# Patient Record
Sex: Female | Born: 1985 | Race: Black or African American | Hispanic: No | Marital: Single | Smoking: Former smoker
Health system: Southern US, Community
[De-identification: ages and names within clinical notes are randomized; demographics above are authoritative.]

## PROBLEM LIST (undated history)

## (undated) DIAGNOSIS — N809 Endometriosis, unspecified: Secondary | ICD-10-CM

---
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
## 2007-02-16 ENCOUNTER — Emergency Department (HOSPITAL_COMMUNITY): Admission: EM | Admit: 2007-02-16 | Discharge: 2007-02-16 | Payer: Self-pay | Admitting: Emergency Medicine

## 2008-02-06 ENCOUNTER — Emergency Department (HOSPITAL_COMMUNITY): Admission: EM | Admit: 2008-02-06 | Discharge: 2008-02-06 | Payer: Self-pay | Admitting: Emergency Medicine

## 2009-01-26 ENCOUNTER — Other Ambulatory Visit: Admission: RE | Admit: 2009-01-26 | Discharge: 2009-01-26 | Payer: Self-pay | Admitting: Family Medicine

## 2009-02-18 ENCOUNTER — Emergency Department (HOSPITAL_COMMUNITY): Admission: EM | Admit: 2009-02-18 | Discharge: 2009-02-19 | Payer: Self-pay | Admitting: Emergency Medicine

## 2012-08-02 ENCOUNTER — Emergency Department (HOSPITAL_COMMUNITY): Payer: BC Managed Care – PPO

## 2012-08-02 ENCOUNTER — Encounter (HOSPITAL_COMMUNITY): Payer: Self-pay | Admitting: Emergency Medicine

## 2012-08-02 ENCOUNTER — Emergency Department (HOSPITAL_COMMUNITY)
Admission: EM | Admit: 2012-08-02 | Discharge: 2012-08-02 | Disposition: A | Discharge status: Home / Self Care | Payer: BC Managed Care – PPO | Attending: Emergency Medicine | Admitting: Emergency Medicine

## 2012-08-02 DIAGNOSIS — N8000 Endometriosis of the uterus, unspecified: Secondary | ICD-10-CM | POA: Insufficient documentation

## 2012-08-02 DIAGNOSIS — N898 Other specified noninflammatory disorders of vagina: Secondary | ICD-10-CM | POA: Insufficient documentation

## 2012-08-02 DIAGNOSIS — R109 Unspecified abdominal pain: Secondary | ICD-10-CM

## 2012-08-02 DIAGNOSIS — N939 Abnormal uterine and vaginal bleeding, unspecified: Secondary | ICD-10-CM

## 2012-08-02 DIAGNOSIS — F17200 Nicotine dependence, unspecified, uncomplicated: Secondary | ICD-10-CM | POA: Insufficient documentation

## 2012-08-02 LAB — URINALYSIS, ROUTINE W REFLEX MICROSCOPIC
Glucose, UA: NEGATIVE mg/dL
Nitrite: NEGATIVE
Protein, ur: NEGATIVE mg/dL
Urobilinogen, UA: 0.2 mg/dL (ref 0.0–1.0)
pH: 6.5 (ref 5.0–8.0)

## 2012-08-02 LAB — WET PREP, GENITAL
Trich, Wet Prep: NONE SEEN
Yeast Wet Prep HPF POC: NONE SEEN

## 2012-08-02 LAB — POCT PREGNANCY, URINE: Preg Test, Ur: NEGATIVE

## 2012-08-02 LAB — URINE MICROSCOPIC-ADD ON

## 2012-08-02 NOTE — ED Provider Notes (Signed)
History     CSN: [id number]  Arrival date & time 08/02/12  1022   First MD Initiated Contact with Patient 08/02/12 1118      Chief Complaint  Patient presents with  . Abdominal Cramping    (Consider location/radiation/quality/duration/timing/severity/associated sxs/prior treatment) Patient is a 26 y.o. female presenting with cramps. The history is provided by the patient and medical records. History Limited By: none.  Abdominal Cramping The primary symptoms of the illness include abdominal pain and vaginal bleeding. The primary symptoms of the illness do not include fever, fatigue, shortness of breath, nausea, vomiting, diarrhea, dysuria or vaginal discharge.  Additional symptoms associated with the illness include frequency. Symptoms associated with the illness do not include diaphoresis, constipation, urgency, hematuria or back pain.   SUHAYLA CHISOM is a 26 y.o. female G1P2 who presents to the emergency department complaining of vaginal bleeding since 10:30 PM last night. Last menstrual cycle is approximately 2 weeks ago and normal per the patient.  After the vaginal bleeding began patient began to experience severe lower abdominal cramps and pelvic pressure. About 11 PM the patient began to notice large blood clots and increased bleeding.  Bleeding was heavy throughout most of the night but has decreased this morning.  Patient denies sexual activity and the last 5-6 months.  She admits to an elective pregnancy termination last year and states irregular cycles and bleeding since then. She denies vaginal discharge, odor, pelvic pain.  Her last GYN exam and Pap smear was approximately 5 years ago.  She denies fatigue, fever, chills, nausea, vomiting, diarrhea, chest pain, shortness of breath, back pain.  She has a history of Chlamydia infection 6 years ago.  Nothing makes symptoms better or worse. She hasn't tried anything for the symptoms.  History reviewed. No pertinent past medical  history.  History reviewed. No pertinent past surgical history.  No family history on file.  History  Substance Use Topics  . Smoking status: Current Everyday Smoker  . Smokeless tobacco: Not on file  . Alcohol Use: Yes    OB History    Grav Para Term Preterm Abortions TAB SAB Ect Mult Living                  Review of Systems  Constitutional: Negative for fever, diaphoresis, appetite change, fatigue and unexpected weight change.  HENT: Negative for mouth sores, trouble swallowing, neck pain and neck stiffness.   Respiratory: Negative for cough, chest tightness, shortness of breath, wheezing and stridor.   Cardiovascular: Negative for chest pain and palpitations.  Gastrointestinal: Positive for abdominal pain. Negative for nausea, vomiting, diarrhea, constipation, blood in stool, abdominal distention and rectal pain.  Genitourinary: Positive for frequency, vaginal bleeding, menstrual problem and pelvic pain (pelvic pressure). Negative for dysuria, urgency, hematuria, flank pain, vaginal discharge, difficulty urinating and vaginal pain.  Musculoskeletal: Negative for back pain.  Skin: Negative for rash.  Neurological: Negative for weakness.  Hematological: Negative for adenopathy.  Psychiatric/Behavioral: Negative for confusion.  All other systems reviewed and are negative.    Allergies  Latex  Home Medications   Current Outpatient Rx  Name Route Sig Dispense Refill  . LINACLOTIDE 145 MCG PO CAPS Oral Take 145 mcg by mouth daily.    . ADULT MULTIVITAMIN W/MINERALS CH Oral Take 1 tablet by mouth daily.      BP 125/78  Pulse 72  Temp 98.2 F (36.8 C) (Oral)  Resp 16  SpO2 99%  LMP 08/01/2012  Physical Exam  Nursing  note and vitals reviewed. Constitutional: She appears well-developed and well-nourished. No distress.  HENT:  Head: Normocephalic and atraumatic.  Mouth/Throat: Oropharynx is clear and moist. No oropharyngeal exudate.  Eyes: Conjunctivae are normal.  No scleral icterus.  Neck: Normal range of motion. Neck supple.  Cardiovascular: Normal rate, regular rhythm, normal heart sounds and intact distal pulses.  Exam reveals no gallop and no friction rub.   No murmur heard. Pulmonary/Chest: Effort normal and breath sounds normal. No respiratory distress. She has no wheezes.  Abdominal: Soft. Bowel sounds are normal. She exhibits no mass. There is no tenderness. There is no rebound and no guarding.  Genitourinary: There is no rash, tenderness or lesion on the right labia. There is no rash or lesion on the left labia. Cervix exhibits motion tenderness and friability. Cervix exhibits no discharge. Right adnexum displays tenderness. Left adnexum displays tenderness. There is bleeding around the vagina. No vaginal discharge found.  Musculoskeletal: Normal range of motion. She exhibits no edema.  Neurological: She is alert.       Speech is clear and goal oriented Moves extremities without ataxia  Skin: Skin is warm and dry. She is not diaphoretic.  Psychiatric: She has a normal mood and affect.    ED Course  Procedures (including critical care time)   Labs Reviewed  URINALYSIS, ROUTINE W REFLEX MICROSCOPIC   No results found. Results for orders placed during the hospital encounter of 08/02/12  WET PREP, GENITAL      Component Value Range   Yeast Wet Prep HPF POC NONE SEEN  NONE SEEN   Trich, Wet Prep NONE SEEN  NONE SEEN   Clue Cells Wet Prep HPF POC FEW (*) NONE SEEN   WBC, Wet Prep HPF POC FEW (*) NONE SEEN   Results for orders placed during the hospital encounter of 08/02/12  WET PREP, GENITAL      Component Value Range   Yeast Wet Prep HPF POC NONE SEEN  NONE SEEN   Trich, Wet Prep NONE SEEN  NONE SEEN   Clue Cells Wet Prep HPF POC FEW (*) NONE SEEN   WBC, Wet Prep HPF POC FEW (*) NONE SEEN  POCT PREGNANCY, URINE      Component Value Range   Preg Test, Ur NEGATIVE  NEGATIVE   US Transvaginal Non-ob  08/02/2012  *RADIOLOGY REPORT*   Clinical Data:  Abdominal cramping.  Ovarian torsion.  Heavy bleeding with clots.  Pelvic pain.  TRANSABDOMINAL AND TRANSVAGINAL ULTRASOUND OF PELVIS DOPPLER ULTRASOUND OF OVARIES  Technique:  Both transabdominal and transvaginal ultrasound examinations of the pelvis were performed. Transabdominal technique was performed for global imaging of the pelvis including uterus, ovaries, adnexal regions, and pelvic cul-de-sac.  It was necessary to proceed with endovaginal exam following the transabdominal exam to visualize the uterus, adnexa and endometrium.  Color and duplex Doppler ultrasound was utilized to evaluate blood flow to the ovaries.  Comparison:  None.  Findings:  Uterus:  7.4 cm x 5.0 cm x 5.4 cm.  Heterogeneous echotexture compatible with adenomyosis.  Endometrium:  9 mm, within normal limits.  Right ovary: Normal appearance/no adnexal mass  Left ovary:   Normal appearance/no adnexal mass  Pulsed Doppler evaluation demonstrates normal low-resistance arterial and venous waveforms in both ovaries.  Small amount of free fluid is present in the anatomic pelvis.  IMPRESSION:  1.  Heterogeneous uterine echotexture suggesting adenomyosis. 2.  Physiologic appearance of the uterus and adnexa.  Negative for torsion.  Original Report Authenticated By:  Andreas Newport, M.D.   US Pelvis Complete  08/02/2012  *RADIOLOGY REPORT*  Clinical Data:  Abdominal cramping.  Ovarian torsion.  Heavy bleeding with clots.  Pelvic pain.  TRANSABDOMINAL AND TRANSVAGINAL ULTRASOUND OF PELVIS DOPPLER ULTRASOUND OF OVARIES  Technique:  Both transabdominal and transvaginal ultrasound examinations of the pelvis were performed. Transabdominal technique was performed for global imaging of the pelvis including uterus, ovaries, adnexal regions, and pelvic cul-de-sac.  It was necessary to proceed with endovaginal exam following the transabdominal exam to visualize the uterus, adnexa and endometrium.  Color and duplex Doppler ultrasound was  utilized to evaluate blood flow to the ovaries.  Comparison:  None.  Findings:  Uterus:  7.4 cm x 5.0 cm x 5.4 cm.  Heterogeneous echotexture compatible with adenomyosis.  Endometrium:  9 mm, within normal limits.  Right ovary: Normal appearance/no adnexal mass  Left ovary:   Normal appearance/no adnexal mass  Pulsed Doppler evaluation demonstrates normal low-resistance arterial and venous waveforms in both ovaries.  Small amount of free fluid is present in the anatomic pelvis.  IMPRESSION:  1.  Heterogeneous uterine echotexture suggesting adenomyosis. 2.  Physiologic appearance of the uterus and adnexa.  Negative for torsion.  Original Report Authenticated By: Andreas Newport, M.D.   [place] Art/ven Flow Abd Pelv Doppler  08/02/2012  *RADIOLOGY REPORT*  Clinical Data:  Abdominal cramping.  Ovarian torsion.  Heavy bleeding with clots.  Pelvic pain.  TRANSABDOMINAL AND TRANSVAGINAL ULTRASOUND OF PELVIS DOPPLER ULTRASOUND OF OVARIES  Technique:  Both transabdominal and transvaginal ultrasound examinations of the pelvis were performed. Transabdominal technique was performed for global imaging of the pelvis including uterus, ovaries, adnexal regions, and pelvic cul-de-sac.  It was necessary to proceed with endovaginal exam following the transabdominal exam to visualize the uterus, adnexa and endometrium.  Color and duplex Doppler ultrasound was utilized to evaluate blood flow to the ovaries.  Comparison:  None.  Findings:  Uterus:  7.4 cm x 5.0 cm x 5.4 cm.  Heterogeneous echotexture compatible with adenomyosis.  Endometrium:  9 mm, within normal limits.  Right ovary: Normal appearance/no adnexal mass  Left ovary:   Normal appearance/no adnexal mass  Pulsed Doppler evaluation demonstrates normal low-resistance arterial and venous waveforms in both ovaries.  Small amount of free fluid is present in the anatomic pelvis.  IMPRESSION:  1.  Heterogeneous uterine echotexture suggesting adenomyosis. 2.  Physiologic appearance of  the uterus and adnexa.  Negative for torsion.  Original Report Authenticated By: Andreas Newport, M.D.    1. Abdominal pain   2. Vaginal bleeding       MDM  KHADEEJAH CASTNER presents with vaginal bleeding and abdominal pain.  Will rule out pregnancy, UTI and perform an STI screen.  Pelvic exam with cervical motion tenderness, adnexal tenderness and cervical friability gives way to concern for PID, however pt has been celibate for >88mos and without symptoms prior to last night.  Pelvic ultrasound to look for masses or cysts.  UA without evidence of a UTI.  [place] with adenomyosis; no evidence of torsion, masses or fullness making PID unlikely.  This would explain her irregular menses and heavy and painful menstruation.  I will have her follow up with a GYN.    1. Medications: usual home meds 2. Treatment: rest, NSAIDs for pain control 3. Follow Up: with GYN '        Zyrell Carmean, PA-C 08/02/12 1639

## 2012-08-02 NOTE — ED Notes (Signed)
Pt. Stated, i started having abd. Cramping with bleeding, I just had a period 2 weeks ago.

## 2012-08-02 NOTE — ED Provider Notes (Signed)
Medical screening examination/treatment/procedure(s) were performed by non-physician practitioner and as supervising physician I was immediately available for consultation/collaboration.   Milcah Dulany B. Bernette Mayers, MD 08/02/12 2109

## 2013-07-25 ENCOUNTER — Encounter (HOSPITAL_COMMUNITY): Payer: Self-pay | Admitting: Emergency Medicine

## 2013-07-25 DIAGNOSIS — Z3202 Encounter for pregnancy test, result negative: Secondary | ICD-10-CM | POA: Insufficient documentation

## 2013-07-25 DIAGNOSIS — F17200 Nicotine dependence, unspecified, uncomplicated: Secondary | ICD-10-CM | POA: Insufficient documentation

## 2013-07-25 DIAGNOSIS — Z5320 Procedure and treatment not carried out because of patient's decision for unspecified reasons: Secondary | ICD-10-CM | POA: Insufficient documentation

## 2013-07-25 DIAGNOSIS — R509 Fever, unspecified: Secondary | ICD-10-CM | POA: Insufficient documentation

## 2013-07-25 LAB — URINALYSIS, ROUTINE W REFLEX MICROSCOPIC
Hgb urine dipstick: NEGATIVE
Ketones, ur: NEGATIVE mg/dL
Protein, ur: NEGATIVE mg/dL
Urobilinogen, UA: 0.2 mg/dL (ref 0.0–1.0)

## 2013-07-25 LAB — CBC WITH DIFFERENTIAL/PLATELET
Basophils Absolute: 0 10*3/uL (ref 0.0–0.1)
HCT: 38 % (ref 36.0–46.0)
Hemoglobin: 12.9 g/dL (ref 12.0–15.0)
Lymphocytes Relative: 9 % — ABNORMAL LOW (ref 12–46)
Monocytes Absolute: 0.6 10*3/uL (ref 0.1–1.0)
Monocytes Relative: 5 % (ref 3–12)
Neutro Abs: 8.9 10*3/uL — ABNORMAL HIGH (ref 1.7–7.7)
Neutrophils Relative %: 85 % — ABNORMAL HIGH (ref 43–77)
WBC: 10.5 10*3/uL (ref 4.0–10.5)

## 2013-07-25 LAB — COMPREHENSIVE METABOLIC PANEL
AST: 15 U/L (ref 0–37)
Alkaline Phosphatase: 45 U/L (ref 39–117)
BUN: 8 mg/dL (ref 6–23)
CO2: 20 mEq/L (ref 19–32)
Chloride: 103 mEq/L (ref 96–112)
Creatinine, Ser: 0.67 mg/dL (ref 0.50–1.10)
GFR calc non Af Amer: >90 mL/min (ref >90)
Potassium: 3.5 mEq/L (ref 3.5–5.1)
Total Bilirubin: 0.4 mg/dL (ref 0.3–1.2)

## 2013-07-25 LAB — CG4 I-STAT (LACTIC ACID): Lactic Acid, Venous: 2.49 mmol/L — ABNORMAL HIGH (ref 0.5–2.2)

## 2013-07-25 LAB — URINE MICROSCOPIC-ADD ON

## 2013-07-25 MED ORDER — ACETAMINOPHEN 325 MG PO TABS
650.0000 mg | ORAL_TABLET | Freq: Four times a day (QID) | ORAL | Status: DC | PRN
  Administered 2013-07-25: 650 mg via ORAL
  Filled 2013-07-25: qty 2

## 2013-07-25 NOTE — ED Notes (Signed)
PT. REPORTS FEVER , CHILLS , HEADACHE AND SLIGHT DYSURIA ONSET TODAY .

## 2013-07-26 ENCOUNTER — Emergency Department (HOSPITAL_COMMUNITY)
Admission: EM | Admit: 2013-07-26 | Discharge: 2013-07-26 | Discharge status: Against Medical Advice | Payer: Self-pay | Attending: Emergency Medicine | Admitting: Emergency Medicine

## 2013-07-27 LAB — URINE CULTURE

## 2013-12-31 ENCOUNTER — Encounter (HOSPITAL_BASED_OUTPATIENT_CLINIC_OR_DEPARTMENT_OTHER): Payer: Self-pay | Admitting: Emergency Medicine

## 2013-12-31 ENCOUNTER — Emergency Department (HOSPITAL_BASED_OUTPATIENT_CLINIC_OR_DEPARTMENT_OTHER)
Admission: EM | Admit: 2013-12-31 | Discharge: 2013-12-31 | Disposition: A | Discharge status: Home / Self Care | Payer: BC Managed Care – PPO | Attending: Emergency Medicine | Admitting: Emergency Medicine

## 2013-12-31 DIAGNOSIS — M79602 Pain in left arm: Secondary | ICD-10-CM

## 2013-12-31 DIAGNOSIS — Z91040 Latex allergy status: Secondary | ICD-10-CM | POA: Insufficient documentation

## 2013-12-31 DIAGNOSIS — M79609 Pain in unspecified limb: Secondary | ICD-10-CM | POA: Insufficient documentation

## 2013-12-31 DIAGNOSIS — F17200 Nicotine dependence, unspecified, uncomplicated: Secondary | ICD-10-CM | POA: Insufficient documentation

## 2013-12-31 NOTE — ED Notes (Addendum)
Pt having left lower arm pain since 2 am.  Pt states it woke her up from sleep.  Had shooting pain, radiating both ways from forearm.  Denies injury.  Pt denies any sleeping medication or etoh.  Pt states she is working two jobs.  Pt states she had some chest pain before but not today.

## 2013-12-31 NOTE — ED Provider Notes (Signed)
Medical screening examination/treatment/procedure(s) were conducted as a shared visit with non-physician practitioner(s) or resident and myself. I personally evaluated the patient during the encounter and agree with the findings and plan unless otherwise indicated.  I have personally reviewed any xrays and/ or EKG's with the provider and I agree with interpretation.  Left arm pain and numbness, worse since this am, worse with movement. No cp or sob. No PE risks or cardiac risks except smoking. No injuries. Exam full rom of elbow and wrist with mild discomfort, soft compartment, nv intact, mild tender ulnar tunnel. Concern for msk vs peripheral n compression during sleep.  Strict reasons to return, well appearing, ekg no acute findings, early repol.  Left arm pain   Enid SkeensJoshua M Rolonda Pontarelli, MD 12/31/13 [phone]

## 2013-12-31 NOTE — ED Provider Notes (Signed)
CSN: [id number]     Arrival date & time 12/31/13  1034 History   First MD Initiated Contact with Patient 12/31/13 1202     Chief Complaint  Patient presents with  . Arm Pain   (Consider location/radiation/quality/duration/timing/severity/associated sxs/prior Treatment) Patient is a 28 y.o. female presenting with arm pain. The history is provided by the patient.  Arm Pain This is a new problem. The current episode started today. The problem occurs constantly. The problem has been unchanged.   Marcheta GrammesLindsey R Keel is a 28 y.o. female who presents to the ED with left arm pain. She noted the pain when she woke this morning. She states that the pain starts in the shoulder and goes to the left hand. She went to sleep watching a movie last night but was sleeping on the right arm. She took ibuprofen this morning that has seemed to help some but she continues to have tingling down her arm. The symptoms increases with range of motion. She does smoke every day. She denies nausea, vomiting, chest pain or shortness of breath.   No past medical history on file. No past surgical history on file. No family history on file. History  Substance Use Topics  . Smoking status: Current Every Day Smoker  . Smokeless tobacco: Not on file  . Alcohol Use: Yes   OB History   Grav Para Term Preterm Abortions TAB SAB Ect Mult Living                 Review of Systems Negative except as stated in HPI.  Allergies  Latex  Home Medications   Current Outpatient Rx  Name  Route  Sig  Dispense  Refill  . Multiple Vitamin (MULTIVITAMIN WITH MINERALS) TABS   Oral   Take 1 tablet by mouth daily.          BP 136/84  Pulse 89  Temp(Src) 98.7 F (37.1 C) (Oral)  Resp 20  Ht 5' 7.5" (1.715 m)  Wt 239 lb (108.41 kg)  BMI 36.86 kg/m2  SpO2 98%  LMP 11/30/2013 Physical Exam  Nursing note and vitals reviewed. Constitutional: She is oriented to person, place, and time. She appears well-developed and well-nourished.  No distress.  HENT:  Head: Normocephalic and atraumatic.  Eyes: Conjunctivae and EOM are normal.  Neck: Neck supple.  Cardiovascular: Normal rate.   Pulmonary/Chest: Effort normal.  Abdominal: Soft. There is no tenderness.  Musculoskeletal:       Left upper arm: She exhibits tenderness. She exhibits no swelling, no deformity and no laceration.       Arms: Tender with palpation forearm and upper arm. Radial pulse strong, adequate circulation. Good touch sensation.   Neurological: She is alert and oriented to person, place, and time. No cranial nerve deficit or sensory deficit.  Skin: Skin is warm and dry.  Psychiatric: She has a normal mood and affect. Her behavior is normal.    Date/Time:  Saturday December 31 2013 10:58:18 EST Ventricular Rate:  83 PR Interval:  170 QRS Duration: 94 QT Interval:  366 QTC Calculation: 430 R Axis:   78 Text Interpretation:  Normal sinus rhythm Early repolarization Normal ECG Confirmed by ZAVITZ  MD, JOSHUA ([id number]) on 12/31/2013 12:49:19 PM       ED Course: Dr. Jodi MourningZavitz in to examine the patient.   Procedures  MDM  28 y.o. female with left arm pain. Has improved with one dose of ibuprofen this am. She will continue to take ibuprofen. She will  return if symptoms worsen or she develops chest pain, nausea, vomiting, fever shortness of breath or other problems.  Discussed with the patient and all questioned fully answered.    Janne Napoleon, [place] 12/31/13 1252

## 2014-02-16 ENCOUNTER — Emergency Department (HOSPITAL_BASED_OUTPATIENT_CLINIC_OR_DEPARTMENT_OTHER)
Admission: EM | Admit: 2014-02-16 | Discharge: 2014-02-16 | Disposition: A | Discharge status: Home / Self Care | Payer: BC Managed Care – PPO | Attending: Emergency Medicine | Admitting: Emergency Medicine

## 2014-02-16 ENCOUNTER — Encounter (HOSPITAL_BASED_OUTPATIENT_CLINIC_OR_DEPARTMENT_OTHER): Payer: Self-pay | Admitting: Emergency Medicine

## 2014-02-16 DIAGNOSIS — R1013 Epigastric pain: Secondary | ICD-10-CM | POA: Insufficient documentation

## 2014-02-16 DIAGNOSIS — Z91040 Latex allergy status: Secondary | ICD-10-CM | POA: Insufficient documentation

## 2014-02-16 DIAGNOSIS — Z3490 Encounter for supervision of normal pregnancy, unspecified, unspecified trimester: Secondary | ICD-10-CM

## 2014-02-16 DIAGNOSIS — O26859 Spotting complicating pregnancy, unspecified trimester: Secondary | ICD-10-CM | POA: Insufficient documentation

## 2014-02-16 DIAGNOSIS — O9989 Other specified diseases and conditions complicating pregnancy, childbirth and the puerperium: Secondary | ICD-10-CM | POA: Insufficient documentation

## 2014-02-16 DIAGNOSIS — Z87891 Personal history of nicotine dependence: Secondary | ICD-10-CM | POA: Insufficient documentation

## 2014-02-16 DIAGNOSIS — Z8742 Personal history of other diseases of the female genital tract: Secondary | ICD-10-CM | POA: Insufficient documentation

## 2014-02-16 DIAGNOSIS — R109 Unspecified abdominal pain: Secondary | ICD-10-CM

## 2014-02-16 HISTORY — DX: Endometriosis, unspecified: N80.9

## 2014-02-16 LAB — URINALYSIS, ROUTINE W REFLEX MICROSCOPIC
Bilirubin Urine: NEGATIVE
Glucose, UA: NEGATIVE mg/dL
Hgb urine dipstick: NEGATIVE
Ketones, ur: NEGATIVE mg/dL
NITRITE: NEGATIVE
PROTEIN: NEGATIVE mg/dL
Specific Gravity, Urine: 1.02 (ref 1.005–1.030)
UROBILINOGEN UA: 0.2 mg/dL (ref 0.0–1.0)
pH: 7 (ref 5.0–8.0)

## 2014-02-16 LAB — PREGNANCY, URINE: Preg Test, Ur: POSITIVE — AB

## 2014-02-16 LAB — COMPREHENSIVE METABOLIC PANEL
ALBUMIN: 4.1 g/dL (ref 3.5–5.2)
ALT: 11 U/L (ref 0–35)
AST: 11 U/L (ref 0–37)
Alkaline Phosphatase: 44 U/L (ref 39–117)
BUN: 8 mg/dL (ref 6–23)
CALCIUM: 9.2 mg/dL (ref 8.4–10.5)
CO2: 20 mEq/L (ref 19–32)
Chloride: 101 mEq/L (ref 96–112)
Creatinine, Ser: 0.6 mg/dL (ref 0.50–1.10)
GFR calc Af Amer: >90 mL/min (ref >90)
GFR calc non Af Amer: >90 mL/min (ref >90)
Glucose, Bld: 88 mg/dL (ref 70–99)
POTASSIUM: 3.8 meq/L (ref 3.7–5.3)
SODIUM: 136 meq/L — AB (ref 137–147)
Total Bilirubin: <0.2 mg/dL — ABNORMAL LOW (ref 0.3–1.2)
Total Protein: 7.6 g/dL (ref 6.0–8.3)

## 2014-02-16 LAB — CBC WITH DIFFERENTIAL/PLATELET
Basophils Absolute: 0 10*3/uL (ref 0.0–0.1)
Basophils Relative: 0 % (ref 0–1)
EOS ABS: 0.2 10*3/uL (ref 0.0–0.7)
EOS PCT: 1 % (ref 0–5)
HCT: 36.6 % (ref 36.0–46.0)
Hemoglobin: 12.5 g/dL (ref 12.0–15.0)
LYMPHS ABS: 2.9 10*3/uL (ref 0.7–4.0)
Lymphocytes Relative: 19 % (ref 12–46)
MCH: 30.6 pg (ref 26.0–34.0)
MCHC: 34.2 g/dL (ref 30.0–36.0)
MCV: 89.7 fL (ref 78.0–100.0)
Monocytes Absolute: 1.2 10*3/uL — ABNORMAL HIGH (ref 0.1–1.0)
Monocytes Relative: 8 % (ref 3–12)
Neutro Abs: 11.1 10*3/uL — ABNORMAL HIGH (ref 1.7–7.7)
Neutrophils Relative %: 72 % (ref 43–77)
PLATELETS: 248 10*3/uL (ref 150–400)
RBC: 4.08 MIL/uL (ref 3.87–5.11)
RDW: 12.2 % (ref 11.5–15.5)
WBC: 15.3 10*3/uL — ABNORMAL HIGH (ref 4.0–10.5)

## 2014-02-16 LAB — ABO/RH: ABO/RH(D): O POS

## 2014-02-16 LAB — URINE MICROSCOPIC-ADD ON

## 2014-02-16 LAB — LIPASE, BLOOD: Lipase: 44 U/L (ref 11–59)

## 2014-02-16 LAB — HCG, QUANTITATIVE, PREGNANCY: hCG, Beta Chain, Quant, S: 35918 m[IU]/mL — ABNORMAL HIGH (ref <5)

## 2014-02-16 NOTE — ED Notes (Signed)
Pt c/o center abdominal pain x4, states no menstrual cycle since 1/8, c/o vaginal discharge also

## 2014-02-16 NOTE — Discharge Instructions (Signed)
Return tomorrow at the scheduled time for a formal ultrasound.  Return to the emergency department if you develop severe pain, bleeding, fever, bloody stool, or other new or concerning symptoms.   Abdominal Pain During Pregnancy Abdominal pain is common in pregnancy. Most of the time, it does not cause harm. There are many causes of abdominal pain. Some causes are more serious than others. Some of the causes of abdominal pain in pregnancy are easily diagnosed. Occasionally, the diagnosis takes time to understand. Other times, the cause is not determined. Abdominal pain can be a sign that something is very wrong with the pregnancy, or the pain may have nothing to do with the pregnancy at all. For this reason, always tell your health care provider if you have any abdominal discomfort. HOME CARE INSTRUCTIONS  Monitor your abdominal pain for any changes. The following actions may help to alleviate any discomfort you are experiencing:  Do not have sexual intercourse or put anything in your vagina until your symptoms go away completely.  Get plenty of rest until your pain improves.  Drink clear fluids if you feel nauseous. Avoid solid food as long as you are uncomfortable or nauseous.  Only take over-the-counter or prescription medicine as directed by your health care provider.  Keep all follow-up appointments with your health care provider. SEEK IMMEDIATE MEDICAL CARE IF:  You are bleeding, leaking fluid, or passing tissue from the vagina.  You have increasing pain or cramping.  You have persistent vomiting.  You have painful or bloody urination.  You have a fever.  You notice a decrease in your baby's movements.  You have extreme weakness or feel faint.  You have shortness of breath, with or without abdominal pain.  You develop a severe headache with abdominal pain.  You have abnormal vaginal discharge with abdominal pain.  You have persistent diarrhea.  You have abdominal pain  that continues even after rest, or gets worse. MAKE SURE YOU:   Understand these instructions.  Will watch your condition.  Will get help right away if you are not doing well or get worse. Document Released: 12/15/2005 Document Revised: 10/05/2013 Document Reviewed: 07/14/2013 [facility] Patient Information 2014 [place], [place].

## 2014-02-16 NOTE — ED Provider Notes (Signed)
CSN: [id number]     Arrival date & time 02/16/14  1832 History   This chart was scribed for Geoffery Lyonsouglas Lamaya Hyneman, MD by Ladona Ridgelaylor Day, ED scribe. This patient was seen in room MH07/MH07 and the patient's care was started at 1832.  Chief Complaint  Patient presents with  . Abdominal Pain   The history is provided by the patient. No language interpreter was used.   HPI Comments: Adrienne Torres is a 28 y.o. female who presents to the Emergency Department complaining of constant, mild to moderate upper abdominal pain, onset 3 days ago. She also c/o spotting for the past several days which has improved over the last day.  She denies nausea or emesis episodes, fever/chills, lower abdominal pain, dysuria or urinary sx. LNMP  5 weeks ago. She states eating/drinking doesn't make it worse.   She has a hx of endometriosis, states it does not feel similar to that.  She denies hx of abdominal surgeries.   Past Medical History  Diagnosis Date  . Endometriosis    History reviewed. No pertinent past surgical history. No family history on file. History  Substance Use Topics  . Smoking status: Former Games developermoker  . Smokeless tobacco: Not on file  . Alcohol Use: Yes   OB History   Grav Para Term Preterm Abortions TAB SAB Ect Mult Living                 Review of Systems  Constitutional: Negative for fever and chills.  Respiratory: Negative for cough and shortness of breath.   Cardiovascular: Negative for chest pain.  Gastrointestinal: Positive for abdominal pain. Negative for nausea and vomiting.  Genitourinary: Positive for vaginal bleeding. Negative for dysuria.  Musculoskeletal: Negative for back pain.  Skin: Negative for color change.  All other systems reviewed and are negative.    Allergies  Latex  Home Medications   Current Outpatient Rx  Name  Route  Sig  Dispense  Refill  . Multiple Vitamin (MULTIVITAMIN WITH MINERALS) TABS   Oral   Take 1 tablet by mouth daily.          Triage  Vitals: BP 153/78  Pulse 87  Temp(Src) 98.7 F (37.1 C) (Oral)  Resp 18  Ht 5\' 8"  (1.727 m)  Wt 238 lb 1 oz (107.984 kg)  BMI 36.21 kg/m2  SpO2 100%  LMP 01/05/2014  Physical Exam  Nursing note and vitals reviewed. Constitutional: She is oriented to person, place, and time. She appears well-developed and well-nourished. No distress.  HENT:  Head: Normocephalic and atraumatic.  Eyes: Conjunctivae are normal. Right eye exhibits no discharge. Left eye exhibits no discharge.  Neck: Normal range of motion.  Cardiovascular: Normal rate.   Pulmonary/Chest: Effort normal. No respiratory distress.  Abdominal: Soft. She exhibits no distension. There is tenderness. There is no rebound and no guarding.  Tenderness to palpation in the epigastric region w/out rebound or guarding. There is tenderness to palpation in the suprapubic region w/no rebound or guarding.   Musculoskeletal: Normal range of motion. She exhibits no edema.  Neurological: She is alert and oriented to person, place, and time.  Skin: Skin is warm and dry.  Psychiatric: She has a normal mood and affect. Thought content normal.   ED Course  Procedures (including critical care time) DIAGNOSTIC STUDIES: Oxygen Saturation is 100% on room air, normal by my interpretation.    COORDINATION OF CARE: At 840 PM Discussed treatment plan with patient which includes pregnancy UA, UA, blood work.  Patient agrees.   Labs Review Labs Reviewed  PREGNANCY, URINE - Abnormal; Notable for the following:    Preg Test, Ur POSITIVE (*)    All other components within normal limits  URINALYSIS, ROUTINE W REFLEX MICROSCOPIC - Abnormal; Notable for the following:    APPearance CLOUDY (*)    Leukocytes, UA MODERATE (*)    All other components within normal limits  URINE MICROSCOPIC-ADD ON - Abnormal; Notable for the following:    Squamous Epithelial / LPF FEW (*)    Bacteria, UA FEW (*)    All other components within normal limits  CBC WITH  DIFFERENTIAL - Abnormal; Notable for the following:    WBC 15.3 (*)    Neutro Abs 11.1 (*)    Monocytes Absolute 1.2 (*)    All other components within normal limits  HCG, QUANTITATIVE, PREGNANCY  COMPREHENSIVE METABOLIC PANEL  LIPASE, BLOOD  ABO/RH   Imaging Review No results found.    MDM   Final diagnoses:  None    Patient 28 year old female who presents with complaints of epigastric abdominal discomfort is going on for several days. Her last menstrual period was over one month ago. She denies any difficulty urinating and denies any fevers or chills. She reports slight spotting but denies significant vaginal bleeding. Physical examination reveals tenderness in the epigastric region.    Pregnancy test is positive with a Bhcg of 36k.  Ultrasound performed at bedside reveals what appears to be a gestational sac.  She will return tomorrow for a formal ultrasound, return prn if worsens.  Geoffery Lyons, MD 02/16/14 2215

## 2014-02-17 ENCOUNTER — Other Ambulatory Visit (HOSPITAL_BASED_OUTPATIENT_CLINIC_OR_DEPARTMENT_OTHER): Payer: Self-pay | Admitting: Emergency Medicine

## 2014-02-17 ENCOUNTER — Ambulatory Visit (HOSPITAL_BASED_OUTPATIENT_CLINIC_OR_DEPARTMENT_OTHER): Admission: RE | Admit: 2014-02-17 | Payer: Self-pay | Source: Ambulatory Visit

## 2014-02-17 ENCOUNTER — Ambulatory Visit (HOSPITAL_BASED_OUTPATIENT_CLINIC_OR_DEPARTMENT_OTHER)
Admission: RE | Admit: 2014-02-17 | Discharge: 2014-02-17 | Disposition: A | Discharge status: Home / Self Care | Payer: Self-pay | Source: Ambulatory Visit | Attending: Emergency Medicine | Admitting: Emergency Medicine

## 2014-02-17 DIAGNOSIS — O99891 Other specified diseases and conditions complicating pregnancy: Secondary | ICD-10-CM | POA: Insufficient documentation

## 2014-02-17 DIAGNOSIS — R109 Unspecified abdominal pain: Secondary | ICD-10-CM | POA: Insufficient documentation

## 2014-02-17 DIAGNOSIS — O9989 Other specified diseases and conditions complicating pregnancy, childbirth and the puerperium: Principal | ICD-10-CM

## 2014-02-17 DIAGNOSIS — N809 Endometriosis, unspecified: Secondary | ICD-10-CM | POA: Insufficient documentation

## 2015-06-07 ENCOUNTER — Encounter (HOSPITAL_BASED_OUTPATIENT_CLINIC_OR_DEPARTMENT_OTHER): Payer: Self-pay

## 2015-06-07 ENCOUNTER — Emergency Department (HOSPITAL_BASED_OUTPATIENT_CLINIC_OR_DEPARTMENT_OTHER)
Admission: EM | Admit: 2015-06-07 | Discharge: 2015-06-07 | Disposition: A | Discharge status: Home / Self Care | Payer: Self-pay | Attending: Emergency Medicine | Admitting: Emergency Medicine

## 2015-06-07 DIAGNOSIS — Z91040 Latex allergy status: Secondary | ICD-10-CM | POA: Insufficient documentation

## 2015-06-07 DIAGNOSIS — K029 Dental caries, unspecified: Secondary | ICD-10-CM | POA: Insufficient documentation

## 2015-06-07 DIAGNOSIS — Z87891 Personal history of nicotine dependence: Secondary | ICD-10-CM | POA: Insufficient documentation

## 2015-06-07 DIAGNOSIS — K088 Other specified disorders of teeth and supporting structures: Secondary | ICD-10-CM | POA: Insufficient documentation

## 2015-06-07 DIAGNOSIS — Z8742 Personal history of other diseases of the female genital tract: Secondary | ICD-10-CM | POA: Insufficient documentation

## 2015-06-07 MED ORDER — TRAMADOL HCL 50 MG PO TABS: 50.0000 mg | ORAL_TABLET | Freq: Four times a day (QID) | ORAL | Status: DC | PRN

## 2015-06-07 MED ORDER — PENICILLIN V POTASSIUM 250 MG PO TABS
500.0000 mg | ORAL_TABLET | Freq: Once | ORAL | Status: AC
  Administered 2015-06-07: 500 mg via ORAL
  Filled 2015-06-07: qty 2

## 2015-06-07 MED ORDER — IBUPROFEN 800 MG PO TABS
800.0000 mg | ORAL_TABLET | Freq: Once | ORAL | Status: DC
  Filled 2015-06-07: qty 1

## 2015-06-07 MED ORDER — PENICILLIN V POTASSIUM 500 MG PO TABS: 500.0000 mg | ORAL_TABLET | Freq: Four times a day (QID) | ORAL | Status: DC

## 2015-06-07 MED ORDER — IBUPROFEN 800 MG PO TABS: 800.0000 mg | ORAL_TABLET | Freq: Three times a day (TID) | ORAL | Status: AC | PRN

## 2015-06-07 NOTE — ED Notes (Signed)
C/o left lower tooth pain,  States has cracked tooth,  Has dentist appoint end of june

## 2015-06-07 NOTE — Discharge Instructions (Signed)
Dental Pain °A tooth ache may be caused by cavities (tooth decay). Cavities expose the nerve of the tooth to air and hot or cold temperatures. It may come from an infection or abscess (also called a boil or furuncle) around your tooth. It is also often caused by dental caries (tooth decay). This causes the pain you are having. °DIAGNOSIS  °Your caregiver can diagnose this problem by exam. °TREATMENT  °· If caused by an infection, it may be treated with medications which kill germs (antibiotics) and pain medications as prescribed by your caregiver. Take medications as directed. °· Only take over-the-counter or prescription medicines for pain, discomfort, or fever as directed by your caregiver. °· Whether the tooth ache today is caused by infection or dental disease, you should see your dentist as soon as possible for further care. °SEEK MEDICAL CARE IF: °The exam and treatment you received today has been provided on an emergency basis only. This is not a substitute for complete medical or dental care. If your problem worsens or new problems (symptoms) appear, and you are unable to meet with your dentist, call or return to this location. °SEEK IMMEDIATE MEDICAL CARE IF:  °· You have a fever. °· You develop redness and swelling of your face, jaw, or neck. °· You are unable to open your mouth. °· You have severe pain uncontrolled by pain medicine. °MAKE SURE YOU:  °· Understand these instructions. °· Will watch your condition. °· Will get help right away if you are not doing well or get worse. °Document Released: 12/15/2005 Document Revised: 03/08/2012 Document Reviewed: 08/02/2008 °ExitCare® Patient Information ©2015 [employer]. This information is not intended to replace advice given to you by your health care provider. Make sure you discuss any questions you have with your health care provider. ° °Dental Caries °Dental caries (also called tooth decay) is the most common oral disease. It can occur at any age but is  more common in children and young adults.  °HOW DENTAL CARIES DEVELOPS  °The process of decay begins when bacteria and foods (particularly sugars and starches) combine in your mouth to produce plaque. Plaque is a substance that sticks to the hard, outer surface of a tooth (enamel). The bacteria in plaque produce acids that attack enamel. These acids may also attack the root surface of a tooth (cementum) if it is exposed. Repeated attacks dissolve these surfaces and create holes in the tooth (cavities). If left untreated, the acids destroy the other layers of the tooth.  °RISK FACTORS °· Frequent sipping of sugary beverages.   °· Frequent snacking on sugary and starchy foods, especially those that easily get stuck in the teeth.   °· Poor oral hygiene.   °· Dry mouth.   °· Substance abuse such as methamphetamine abuse.   °· Broken or poor-fitting dental restorations.   °· Eating disorders.   °· Gastroesophageal reflux disease (GERD).   °· Certain radiation treatments to the head and neck. °SYMPTOMS °In the early stages of dental caries, symptoms are seldom present. Sometimes white, chalky areas may be seen on the enamel or other tooth layers. In later stages, symptoms may include: °· Pits and holes on the enamel. °· Toothache after sweet, hot, or cold foods or drinks are consumed. °· Pain around the tooth. °· Swelling around the tooth. °DIAGNOSIS  °Most of the time, dental caries is detected during a regular dental checkup. A diagnosis is made after a thorough medical and dental history is taken and the surfaces of your teeth are checked for signs of   dental caries. Sometimes special instruments, such as lasers, are used to check for dental caries. Dental X-ray exams may be taken so that areas not visible to the eye (such as between the contact areas of the teeth) can be checked for cavities.  °TREATMENT  °If dental caries is in its early stages, it may be reversed with a fluoride treatment or an application of a  remineralizing agent at the dental office. Thorough brushing and flossing at home is needed to aid these treatments. If it is in its later stages, treatment depends on the location and extent of tooth destruction:  °· If a small area of the tooth has been destroyed, the destroyed area will be removed and cavities will be filled with a material such as gold, silver amalgam, or composite resin.   °· If a large area of the tooth has been destroyed, the destroyed area will be removed and a cap (crown) will be fitted over the remaining tooth structure.   °· If the center part of the tooth (pulp) is affected, a procedure called a root canal will be needed before a filling or crown can be placed.   °· If most of the tooth has been destroyed, the tooth may need to be pulled (extracted). °HOME CARE INSTRUCTIONS °You can prevent, stop, or reverse dental caries at home by practicing good oral hygiene. Good oral hygiene includes: °· Thoroughly cleaning your teeth at least twice a day with a toothbrush and dental floss.   °· Using a fluoride toothpaste. A fluoride mouth rinse may also be used if recommended by your dentist or health care provider.   °· Restricting the amount of sugary and starchy foods and sugary liquids you consume.   °· Avoiding frequent snacking on these foods and sipping of these liquids.   °· Keeping regular visits with a dentist for checkups and cleanings. °PREVENTION  °· Practice good oral hygiene. °· Consider a dental sealant. A dental sealant is a coating material that is applied by your dentist to the pits and grooves of teeth. The sealant prevents food from being trapped in them. It may protect the teeth for several years. °· Ask about fluoride supplements if you live in a community without fluorinated water or with water that has a low fluoride content. Use fluoride supplements as directed by your dentist or health care provider. °· Allow fluoride varnish applications to teeth if directed by your  dentist or health care provider. °Document Released: 09/06/2002 Document Revised: 05/01/2014 Document Reviewed: 12/17/2012 °ExitCare® Patient Information ©2015 [employer]. This information is not intended to replace advice given to you by your health care provider. Make sure you discuss any questions you have with your health care provider. ° °

## 2015-06-07 NOTE — ED Provider Notes (Signed)
TIME SEEN: This chart was scribed for Layla Maw Ward, DO by Octavia Heir, ED Scribe. This patient was seen in room MH08/MH08 and the patient's care was started at 10:24 PM.   CHIEF COMPLAINT: dental pain  HPI:  HPI Comments: Adrienne Torres is a 29 y.o. female who presents to the Emergency Department complaining of constant, gradual worsening left sided lower dental pain onset yesterday. She reports the pain radiated from her tooth to her left ear and into her head. Pt notes she cracked her left lower tooth 4 days ago and followed up with her dentist and has been following instructions that were given to her. Pt denies fever and difficulty swallowing. No facial swelling or warmth. Has an appointment with a dentist June 28.  Pt drove herself to the ED.   ROS: See HPI Constitutional: no fever  Eyes: no drainage  ENT: no runny nose   Cardiovascular:  no chest pain  Resp: no SOB  GI: no vomiting GU: no dysuria Integumentary: no rash  Allergy: no hives  Musculoskeletal: no leg swelling  Neurological: no slurred speech ROS otherwise negative  PAST MEDICAL HISTORY/PAST SURGICAL HISTORY:  Past Medical History  Diagnosis Date  . Endometriosis     MEDICATIONS:  Prior to Admission medications   Medication Sig Start Date End Date Taking? Authorizing Provider  benzocaine (ORAJEL) 10 % mucosal gel Use as directed 1 application in the mouth or throat as needed for mouth pain.   Yes Historical Provider, MD  ibuprofen (ADVIL,MOTRIN) 600 MG tablet Take 600 mg by mouth every 6 (six) hours as needed.   Yes Historical Provider, MD    ALLERGIES:  Allergies  Allergen Reactions  . Latex Swelling    SOCIAL HISTORY:  History  Substance Use Topics  . Smoking status: Former Games developer  . Smokeless tobacco: Not on file  . Alcohol Use: No    FAMILY HISTORY: No family history on file.  EXAM: Triage vitals: BP 147/78 mmHg  Pulse 77  Temp(Src) 98.5 F (36.9 C) (Oral)  Resp 18  Ht 5' 7.5"  (1.715 m)  Wt 251 lb (113.853 kg)  BMI 38.71 kg/m2  SpO2 97%  LMP 05/24/2015 CONSTITUTIONAL: Alert and oriented and responds appropriately to questions. Appears uncomfortable but is nontoxic, afebrile HEAD: Normocephalic EYES: Conjunctivae clear, PERRL ENT: normal nose; no rhinorrhea; moist mucous membranes; pharynx without lesions noted, she has a fractured second left lower molar without obvious sign of abscess with a cracked filling, no trismus or drooling, no Ludwig angina, tongue sits flat in the bottom of the mouth, no stridor, normal phonation, no facial swelling, erythema or warmth, TMs are clear bilaterally NECK: Supple, no meningismus, no LAD  CARD: RRR; S1 and S2 appreciated; no murmurs, no clicks, no rubs, no gallops RESP: Normal chest excursion without splinting or tachypnea; breath sounds clear and equal bilaterally; no wheezes, no rhonchi, no rales, no hypoxia or respiratory distress, speaking full sentences ABD/GI: Normal bowel sounds; non-distended; soft, non-tender, no rebound, no guarding, no peritoneal signs BACK:  The back appears normal and is non-tender to palpation, there is no CVA tenderness EXT: Normal ROM in all joints; non-tender to palpation; no edema; normal capillary refill; no cyanosis, no calf tenderness or swelling    SKIN: Normal color for age and race; warm NEURO: Moves all extremities equally, sensation to light touch intact diffusely, cranial nerves II through XII intact PSYCH: The patient's mood and manner are appropriate. Grooming and personal hygiene are appropriate.  MEDICAL  DECISION MAKING: Patient here with dental caries causing dental pain. We'll place on penicillin prophylactically and discharge with ibuprofen, tramadol. She has an appointment June 28 with her dentist. Discussed return precautions. No sign of blood was injured on exam. She verbalized understanding is comfortable with plan.   I personally performed the services described in this  documentation, which was scribed in my presence. The recorded information has been reviewed and is accurate.   Layla Maw Ward, DO 06/07/15 2334

## 2015-06-07 NOTE — ED Notes (Signed)
Cracked left lower tooth Sunday-pain started yesterday after pt "digging in it"-dentist appt 6/28

## 2015-08-27 ENCOUNTER — Emergency Department (HOSPITAL_BASED_OUTPATIENT_CLINIC_OR_DEPARTMENT_OTHER)
Admission: EM | Admit: 2015-08-27 | Discharge: 2015-08-27 | Disposition: A | Discharge status: Home / Self Care | Payer: Self-pay | Attending: Emergency Medicine | Admitting: Emergency Medicine

## 2015-08-27 ENCOUNTER — Encounter (HOSPITAL_BASED_OUTPATIENT_CLINIC_OR_DEPARTMENT_OTHER): Payer: Self-pay | Admitting: *Deleted

## 2015-08-27 DIAGNOSIS — Z91040 Latex allergy status: Secondary | ICD-10-CM | POA: Insufficient documentation

## 2015-08-27 DIAGNOSIS — Z8742 Personal history of other diseases of the female genital tract: Secondary | ICD-10-CM | POA: Insufficient documentation

## 2015-08-27 DIAGNOSIS — R040 Epistaxis: Secondary | ICD-10-CM | POA: Insufficient documentation

## 2015-08-27 DIAGNOSIS — Z792 Long term (current) use of antibiotics: Secondary | ICD-10-CM | POA: Insufficient documentation

## 2015-08-27 NOTE — ED Provider Notes (Signed)
CSN: [id number]     Arrival date & time 08/27/15  1126 History   First MD Initiated Contact with Patient 08/27/15 1150     Chief Complaint  Patient presents with  . Epistaxis     (Consider location/radiation/quality/duration/timing/severity/associated sxs/prior Treatment) HPI Adrienne Torres is a 29 y.o. female history of endometriosis comes in for evaluation of nosebleed. Patient states this morning at approximately 6 AM, while in the shower she started to experience a nosebleed. She reports initially her nose is bleeding out of the left nostril. She applied pressure, the bleeding stopped for approximately 45 minutes and returned after she sneezed. She reports applying pressure once more in the bleeding subsided. She complains of feeling blood going down the back of her throat, coughing up the blood. No other aggravating or modifying factors. Denies any bleeding disorders. Denies any cocaine use or intranasal sprays. Bleeding stopped in the ED. Past Medical History  Diagnosis Date  . Endometriosis    History reviewed. No pertinent past surgical history. No family history on file. Social History  Substance Use Topics  . Smoking status: Former Games developer  . Smokeless tobacco: None  . Alcohol Use: No   OB History    No data available     Review of Systems A 10 point review of systems was completed and was negative except for pertinent positives and negatives as mentioned in the history of present illness     Allergies  Latex  Home Medications   Prior to Admission medications   Medication Sig Start Date End Date Taking? Authorizing Provider  benzocaine (ORAJEL) 10 % mucosal gel Use as directed 1 application in the mouth or throat as needed for mouth pain.    Historical Provider, MD  ibuprofen (ADVIL,MOTRIN) 800 MG tablet Take 1 tablet (800 mg total) by mouth every 8 (eight) hours as needed for mild pain. 06/07/15   Kristen N Ward, DO  penicillin v potassium (VEETID) 500 MG tablet  Take 1 tablet (500 mg total) by mouth 4 (four) times daily. 06/07/15   Kristen N Ward, DO  traMADol (ULTRAM) 50 MG tablet Take 1 tablet (50 mg total) by mouth every 6 (six) hours as needed. 06/07/15   Kristen N Ward, DO   BP 141/79 mmHg  Pulse 77  Temp(Src) 99.1 F (37.3 C) (Oral)  Resp 18  Ht 5\' 7"  (1.702 m)  Wt 245 lb (111.131 kg)  BMI 38.36 kg/m2  SpO2 100%  LMP 08/13/2015 Physical Exam  Constitutional: She is oriented to person, place, and time. She appears well-developed and well-nourished.  HENT:  Head: Normocephalic and atraumatic.  Mouth/Throat: Oropharynx is clear and moist.  No bleeding in the ED. Small, mild blood streaks in posterior oropharynx. No abnormalities in anterior nares.  Eyes: Conjunctivae are normal. Pupils are equal, round, and reactive to light. Right eye exhibits no discharge. Left eye exhibits no discharge. No scleral icterus.  Neck: Neck supple.  Cardiovascular: Normal rate, regular rhythm and normal heart sounds.   Pulmonary/Chest: Effort normal and breath sounds normal. No respiratory distress. She has no wheezes. She has no rales.  Abdominal: Soft. There is no tenderness.  Musculoskeletal: She exhibits no tenderness.  Neurological: She is alert and oriented to person, place, and time.  Cranial Nerves II-XII grossly intact  Skin: Skin is warm and dry. No rash noted.  Psychiatric: She has a normal mood and affect.  Nursing note and vitals reviewed.   ED Course  Procedures (including critical care time) Labs  Review Labs Reviewed - No data to display  Imaging Review No results found. I have personally reviewed and evaluated these images and lab results as part of my medical decision-making.   EKG Interpretation None     Meds given in ED:  Medications - No data to display  Discharge Medication List as of 08/27/2015 12:45 PM     Filed Vitals:   08/27/15 1137 08/27/15 1251  BP: 159/97 141/79  Pulse: 93 77  Temp: 99.1 F (37.3 C)   TempSrc:  Oral   Resp: 20 18  Height:  (1.702 m)   Weight: 245 lb (111.131 kg)   SpO2: 99% 100%    MDM  Vitals stable - WNL -afebrile Pt resting comfortably in ED. PE--hemostasis achieved prior to my evaluation. Discussed further symptomatic support at home, encouraged avoidance of aggressive nose blowing, environmental exposures that dry mucous membranes including the fans, air conditioning units, etc.  I discussed all relevant lab findings and imaging results with pt and they verbalized understanding. Discussed f/u with PCP within 48 hrs and return precautions, pt very amenable to plan.  Final diagnoses:  Epistaxis       Joycie Peek, PA-C 08/27/15 1540  Rolan Bucco, MD 08/27/15 1547

## 2015-08-27 NOTE — ED Notes (Signed)
Nose bleed this am on and off. She sneezed and it started again. She was not holding pressure on arrival. Education done. Active bleeding on arrival and She is vomiting at triage.

## 2015-08-27 NOTE — Discharge Instructions (Signed)

## 2018-02-05 ENCOUNTER — Other Ambulatory Visit: Payer: Self-pay

## 2018-02-05 ENCOUNTER — Emergency Department (HOSPITAL_BASED_OUTPATIENT_CLINIC_OR_DEPARTMENT_OTHER): Payer: Self-pay

## 2018-02-05 ENCOUNTER — Encounter (HOSPITAL_BASED_OUTPATIENT_CLINIC_OR_DEPARTMENT_OTHER): Payer: Self-pay | Admitting: *Deleted

## 2018-02-05 ENCOUNTER — Emergency Department (HOSPITAL_BASED_OUTPATIENT_CLINIC_OR_DEPARTMENT_OTHER)
Admission: EM | Admit: 2018-02-05 | Discharge: 2018-02-05 | Disposition: A | Discharge status: Home / Self Care | Payer: Self-pay | Attending: Emergency Medicine | Admitting: Emergency Medicine

## 2018-02-05 DIAGNOSIS — J40 Bronchitis, not specified as acute or chronic: Secondary | ICD-10-CM | POA: Insufficient documentation

## 2018-02-05 DIAGNOSIS — Z79899 Other long term (current) drug therapy: Secondary | ICD-10-CM | POA: Insufficient documentation

## 2018-02-05 DIAGNOSIS — R21 Rash and other nonspecific skin eruption: Secondary | ICD-10-CM | POA: Insufficient documentation

## 2018-02-05 DIAGNOSIS — J209 Acute bronchitis, unspecified: Secondary | ICD-10-CM

## 2018-02-05 LAB — CBC WITH DIFFERENTIAL/PLATELET
BASOS ABS: 0 10*3/uL (ref 0.0–0.1)
Basophils Relative: 0 %
EOS ABS: 0.2 10*3/uL (ref 0.0–0.7)
Eosinophils Relative: 3 %
HCT: 38.6 % (ref 36.0–46.0)
Hemoglobin: 13.2 g/dL (ref 12.0–15.0)
LYMPHS PCT: 35 %
Lymphs Abs: 2.2 10*3/uL (ref 0.7–4.0)
MCH: 30.8 pg (ref 26.0–34.0)
MCHC: 34.2 g/dL (ref 30.0–36.0)
MCV: 90 fL (ref 78.0–100.0)
Monocytes Absolute: 0.6 10*3/uL (ref 0.1–1.0)
Monocytes Relative: 9 %
Neutro Abs: 3.5 10*3/uL (ref 1.7–7.7)
Neutrophils Relative %: 53 %
PLATELETS: 274 10*3/uL (ref 150–400)
RBC: 4.29 MIL/uL (ref 3.87–5.11)
RDW: 12.5 % (ref 11.5–15.5)
WBC: 6.4 10*3/uL (ref 4.0–10.5)

## 2018-02-05 LAB — BASIC METABOLIC PANEL
ANION GAP: 9 (ref 5–15)
BUN: 8 mg/dL (ref 6–20)
CO2: 22 mmol/L (ref 22–32)
Calcium: 8.9 mg/dL (ref 8.9–10.3)
Chloride: 107 mmol/L (ref 101–111)
Creatinine, Ser: 0.82 mg/dL (ref 0.44–1.00)
Glucose, Bld: 90 mg/dL (ref 65–99)
Potassium: 4.1 mmol/L (ref 3.5–5.1)
SODIUM: 138 mmol/L (ref 135–145)

## 2018-02-05 MED ORDER — ALBUTEROL SULFATE HFA 108 (90 BASE) MCG/ACT IN AERS
1.0000 | INHALATION_SPRAY | RESPIRATORY_TRACT | Status: DC | PRN
  Administered 2018-02-05: 2 via RESPIRATORY_TRACT
  Filled 2018-02-05: qty 6.7

## 2018-02-05 MED ORDER — BENZONATATE 100 MG PO CAPS: 100.0000 mg | ORAL_CAPSULE | Freq: Three times a day (TID) | ORAL | 0 refills | Status: DC | PRN

## 2018-02-05 MED ORDER — LORATADINE 10 MG PO TABS: 10.0000 mg | ORAL_TABLET | Freq: Every day | ORAL | 0 refills | Status: AC

## 2018-02-05 MED ORDER — PREDNISONE 20 MG PO TABS: ORAL_TABLET | ORAL | 0 refills | Status: DC

## 2018-02-05 MED FILL — BENZONATATE 100 MG CAPSULE: 100 | 7 days supply | Qty: 21 | Fill #0

## 2018-02-05 MED FILL — predniSONE 20 MG TABS: 20 | 5 days supply | Qty: 11 | Fill #0

## 2018-02-05 NOTE — ED Triage Notes (Signed)
Pt reports cough and congestion x last Friday, also a red spot on her left breast that she noticed in the shower last night.

## 2018-02-05 NOTE — ED Provider Notes (Signed)
[facility] EMERGENCY DEPARTMENT Provider Note   CSN: [id number] Arrival date & time: 02/05/18  0602     History   Chief Complaint Chief Complaint  Patient presents with  . Cough    HPI Adrienne Torres is a 32 y.o. female.  HPI Patient had 5 days of cough and nasal congestion.  States that she had low-grade fever initially but that has improved.  Cough is hacking and nonproductive.  She has some chest soreness with cough.  She also states that she has had generalized fatigue and weakness.  Recently got off of her.  And states she has heavy periods due to her endometriosis.  Denies current vaginal bleeding or discharge.  Patient also states that she noticed a red spot on her breast this morning in the shower.  No known insect bites.  No itching or tenderness at the area. Past Medical History:  Diagnosis Date  . Endometriosis     There are no active problems to display for this patient.   History reviewed. No pertinent surgical history.  OB History    No data available       Home Medications    Prior to Admission medications   Medication Sig Start Date End Date Taking? Authorizing Provider  benzocaine (ORAJEL) 10 % mucosal gel Use as directed 1 application in the mouth or throat as needed for mouth pain.    [provider]  benzonatate (TESSALON) 100 MG capsule Take 1 capsule (100 mg total) by mouth 3 (three) times daily as needed for cough. 02/05/18   Loren Racer, MD  ibuprofen (ADVIL,MOTRIN) 800 MG tablet Take 1 tablet (800 mg total) by mouth every 8 (eight) hours as needed for mild pain. 06/07/15   Ward, Layla Maw, DO  loratadine (CLARITIN) 10 MG tablet Take 1 tablet (10 mg total) by mouth daily. 02/05/18   Loren Racer, MD  penicillin v potassium (VEETID) 500 MG tablet Take 1 tablet (500 mg total) by mouth 4 (four) times daily. 06/07/15   Ward, Layla Maw, DO  predniSONE (DELTASONE) 20 MG tablet 3 tabs po day one, then 2 po daily x 4 days 02/05/18    Loren Racer, MD  traMADol (ULTRAM) 50 MG tablet Take 1 tablet (50 mg total) by mouth every 6 (six) hours as needed. 06/07/15   Ward, Layla Maw, DO    Family History History reviewed. No pertinent family history.  Social History Social History   Tobacco Use  . Smoking status: Former Games developer  . Smokeless tobacco: Never Used  Substance Use Topics  . Alcohol use: No  . Drug use: No     Allergies   Latex   Review of Systems Review of Systems  Constitutional: Positive for chills, fatigue and fever. Negative for diaphoresis.  HENT: Positive for congestion. Negative for sinus pressure, sore throat, trouble swallowing and voice change.   Respiratory: Positive for cough. Negative for shortness of breath and wheezing.   Cardiovascular: Positive for chest pain. Negative for palpitations and leg swelling.  Gastrointestinal: Negative for abdominal pain, diarrhea, nausea and vomiting.  Genitourinary: Negative for dysuria, flank pain, frequency, hematuria, pelvic pain, vaginal bleeding and vaginal discharge.  Musculoskeletal: Negative for back pain, myalgias, neck pain and neck stiffness.  Skin: Positive for rash. Negative for wound.  Neurological: Negative for dizziness, syncope, weakness, numbness and headaches.  Psychiatric/Behavioral: The patient is nervous/anxious.   All other systems reviewed and are negative.    Physical Exam Updated Vital Signs BP 138/74 (  BP Location: Right Arm)   Pulse 76   Temp 98.4 F (36.9 C) (Oral)   Resp 18   Ht 5\' 7"  (1.702 m)   Wt 107 kg (236 lb)   LMP 01/29/2018 (Exact Date)   SpO2 100%   BMI 36.96 kg/m   Physical Exam  Constitutional: She is oriented to person, place, and time. She appears well-developed and well-nourished. No distress.  HENT:  Head: Normocephalic and atraumatic.  Mouth/Throat: Oropharynx is clear and moist.  Bilateral nasal mucosal edema.  Oropharynx is mildly erythematous.  No tonsillar exudates.  Eyes: EOM are normal.  Pupils are equal, round, and reactive to light.  Neck: Normal range of motion. Neck supple.  No meningismus.  No lymphadenopathy.  Cardiovascular: Normal rate and regular rhythm. Exam reveals no gallop and no friction rub.  No murmur heard. Pulmonary/Chest: Effort normal.  Mildly diminished breath sounds bilateral bases.  Abdominal: Soft. Bowel sounds are normal. There is no tenderness. There is no rebound and no guarding.  Musculoskeletal: Normal range of motion. She exhibits no edema or tenderness.  No CVA tenderness.  No lower extremity swelling or asymmetry.  Neurological: She is alert and oriented to person, place, and time.  5/5 motor in all extremities.  Sensation fully intact.  Skin: Skin is warm and dry. Rash noted. She is not diaphoretic. No erythema.  Patient has single erythematous macule that is roughly 1.5 cm in diameter on the medial surface of the left breast.  There is no underlying induration or fluctuance.  No tenderness to palpation.  No warmth.  Psychiatric: She has a normal mood and affect. Her behavior is normal.  Nursing note and vitals reviewed.    ED Treatments / Results  Labs (all labs ordered are listed, but only abnormal results are displayed) Labs Reviewed  CBC WITH DIFFERENTIAL/PLATELET  BASIC METABOLIC PANEL    EKG  EKG Interpretation None       Radiology Dg Chest 2 View  Result Date: 02/05/2018 CLINICAL DATA:  Cough for 2 days EXAM: CHEST  2 VIEW COMPARISON:  02/18/2009 FINDINGS: The heart size and mediastinal contours are within normal limits. Both lungs are clear. The visualized skeletal structures are unremarkable. IMPRESSION: No active cardiopulmonary disease. Electronically Signed   By: Alcide CleverMark  Lukens M.D.   On: 02/05/2018 07:26    Procedures Procedures (including critical care time)  Medications Ordered in ED Medications  albuterol (PROVENTIL HFA;VENTOLIN HFA) 108 (90 Base) MCG/ACT inhaler 1-2 puff (not administered)     Initial  Impression / Assessment and Plan / ED Course  I have reviewed the triage vital signs and the nursing notes.  Pertinent labs & imaging results that were available during my care of the patient were reviewed by me and considered in my medical decision making (see chart for details).    Chest x-ray without acute findings.  Question bronchospasm/bronchitis.  Laboratory workup is within normal limits.  Advised to observe rash and follow-up with primary physician.  Return precautions have been given.   Final Clinical Impressions(s) / ED Diagnoses   Final diagnoses:  Bronchitis with bronchospasm  Rash, skin    ED Discharge Orders        Ordered    predniSONE (DELTASONE) 20 MG tablet     02/05/18 0859    benzonatate (TESSALON) 100 MG capsule  3 times daily PRN     02/05/18 0859    loratadine (CLARITIN) 10 MG tablet  Daily     02/05/18 0859  Loren Racer, MD 02/05/18 0900

## 2018-09-01 ENCOUNTER — Encounter (HOSPITAL_BASED_OUTPATIENT_CLINIC_OR_DEPARTMENT_OTHER): Payer: Self-pay

## 2018-09-01 ENCOUNTER — Emergency Department (HOSPITAL_BASED_OUTPATIENT_CLINIC_OR_DEPARTMENT_OTHER): Payer: Self-pay

## 2018-09-01 ENCOUNTER — Emergency Department (HOSPITAL_BASED_OUTPATIENT_CLINIC_OR_DEPARTMENT_OTHER)
Admission: EM | Admit: 2018-09-01 | Discharge: 2018-09-01 | Disposition: A | Discharge status: Home / Self Care | Payer: Self-pay | Attending: Emergency Medicine | Admitting: Emergency Medicine

## 2018-09-01 ENCOUNTER — Other Ambulatory Visit: Payer: Self-pay

## 2018-09-01 DIAGNOSIS — Z91040 Latex allergy status: Secondary | ICD-10-CM | POA: Insufficient documentation

## 2018-09-01 DIAGNOSIS — K5900 Constipation, unspecified: Secondary | ICD-10-CM | POA: Insufficient documentation

## 2018-09-01 DIAGNOSIS — Z3202 Encounter for pregnancy test, result negative: Secondary | ICD-10-CM | POA: Insufficient documentation

## 2018-09-01 DIAGNOSIS — Z79899 Other long term (current) drug therapy: Secondary | ICD-10-CM | POA: Insufficient documentation

## 2018-09-01 DIAGNOSIS — N3001 Acute cystitis with hematuria: Secondary | ICD-10-CM | POA: Insufficient documentation

## 2018-09-01 DIAGNOSIS — Z87891 Personal history of nicotine dependence: Secondary | ICD-10-CM | POA: Insufficient documentation

## 2018-09-01 LAB — COMPREHENSIVE METABOLIC PANEL
ALT: 12 U/L (ref 0–44)
AST: 14 U/L — ABNORMAL LOW (ref 15–41)
Albumin: 4.1 g/dL (ref 3.5–5.0)
Alkaline Phosphatase: 47 U/L (ref 38–126)
Anion gap: 9 (ref 5–15)
BUN: 8 mg/dL (ref 6–20)
CALCIUM: 8.7 mg/dL — AB (ref 8.9–10.3)
CHLORIDE: 104 mmol/L (ref 98–111)
CO2: 24 mmol/L (ref 22–32)
CREATININE: 0.85 mg/dL (ref 0.44–1.00)
Glucose, Bld: 92 mg/dL (ref 70–99)
Potassium: 3.7 mmol/L (ref 3.5–5.1)
Sodium: 137 mmol/L (ref 135–145)
Total Bilirubin: 0.4 mg/dL (ref 0.3–1.2)
Total Protein: 7.8 g/dL (ref 6.5–8.1)

## 2018-09-01 LAB — URINALYSIS, ROUTINE W REFLEX MICROSCOPIC
Glucose, UA: NEGATIVE mg/dL
KETONES UR: 15 mg/dL — AB
NITRITE: NEGATIVE
PH: 6 (ref 5.0–8.0)
Protein, ur: 30 mg/dL — AB
Specific Gravity, Urine: 1.02 (ref 1.005–1.030)

## 2018-09-01 LAB — CBC WITH DIFFERENTIAL/PLATELET
BASOS ABS: 0 10*3/uL (ref 0.0–0.1)
BASOS PCT: 0 %
EOS PCT: 1 %
Eosinophils Absolute: 0.1 10*3/uL (ref 0.0–0.7)
HCT: 37.9 % (ref 36.0–46.0)
Hemoglobin: 13 g/dL (ref 12.0–15.0)
LYMPHS ABS: 1.8 10*3/uL (ref 0.7–4.0)
LYMPHS PCT: 14 %
MCH: 30.7 pg (ref 26.0–34.0)
MCHC: 34.3 g/dL (ref 30.0–36.0)
MCV: 89.6 fL (ref 78.0–100.0)
Monocytes Absolute: 1.1 10*3/uL — ABNORMAL HIGH (ref 0.1–1.0)
Monocytes Relative: 8 %
Neutro Abs: 9.9 10*3/uL — ABNORMAL HIGH (ref 1.7–7.7)
Neutrophils Relative %: 77 %
PLATELETS: 291 10*3/uL (ref 150–400)
RBC: 4.23 MIL/uL (ref 3.87–5.11)
RDW: 12.1 % (ref 11.5–15.5)
WBC: 12.9 10*3/uL — ABNORMAL HIGH (ref 4.0–10.5)

## 2018-09-01 LAB — URINALYSIS, MICROSCOPIC (REFLEX)

## 2018-09-01 LAB — WET PREP, GENITAL
CLUE CELLS WET PREP: NONE SEEN
Sperm: NONE SEEN
TRICH WET PREP: NONE SEEN
YEAST WET PREP: NONE SEEN

## 2018-09-01 LAB — PREGNANCY, URINE: Preg Test, Ur: NEGATIVE

## 2018-09-01 LAB — LIPASE, BLOOD: Lipase: 31 U/L (ref 11–51)

## 2018-09-01 MED ORDER — POLYETHYLENE GLYCOL 3350 17 G PO PACK: 17.0000 g | PACK | Freq: Every day | ORAL | 0 refills | Status: DC

## 2018-09-01 MED ORDER — CEFTRIAXONE SODIUM 250 MG IJ SOLR
250.0000 mg | Freq: Once | INTRAMUSCULAR | Status: AC
  Administered 2018-09-01: 250 mg via INTRAMUSCULAR
  Filled 2018-09-01: qty 250

## 2018-09-01 MED ORDER — AZITHROMYCIN 250 MG PO TABS
1000.0000 mg | ORAL_TABLET | Freq: Once | ORAL | Status: AC
  Administered 2018-09-01: 1000 mg via ORAL
  Filled 2018-09-01: qty 4

## 2018-09-01 MED ORDER — CEPHALEXIN 500 MG PO CAPS: 500.0000 mg | ORAL_CAPSULE | Freq: Two times a day (BID) | ORAL | 0 refills | Status: AC

## 2018-09-01 MED ORDER — POLYETHYLENE GLYCOL 3350 17 G PO PACK: 17.0000 g | PACK | Freq: Every day | ORAL | 0 refills | Status: AC

## 2018-09-01 MED ORDER — KETOROLAC TROMETHAMINE 30 MG/ML IJ SOLN
30.0000 mg | Freq: Once | INTRAMUSCULAR | Status: AC
  Administered 2018-09-01: 30 mg via INTRAVENOUS
  Filled 2018-09-01: qty 1

## 2018-09-01 NOTE — ED Provider Notes (Signed)
[facility] EMERGENCY DEPARTMENT Provider Note   CSN: [id number] Arrival date & time: 09/01/18  1420     History   Chief Complaint Chief Complaint  Patient presents with  . Abdominal Pain    HPI Adrienne Torres is a 32 y.o. female with PMH/o Endometriosis who presents for evaluation of generalized abdominal pain that began yesterday.  Patient states that she described as an intermittent cramping in the upper abdomen some sometimes intermittent sharp pain that was generalized.  She states there is no alleviating or aggravating factor.  She took over-the-counter Pepto-Bismol, Gas-X with no improvement.  Patient reports that she felt like she had to burp a lot yesterday.  She reports that she had some nausea but was able to eat and drink without any difficulty.  She also reports some discomfort with urination.  She describes it as some lower pressure when she attempts to urinate.  No dysuria, hematuria.  Patient has not taken any other medications for the pain.  She reports that her last bowel movement was approxi-5 to 6 days ago.  She does have a history of constipation and states she will frequently go a week without having bowel movements.  She is still been passing flatus.  She denies any abdominal surgeries.  Patient also reports that 4 days prior to abdominal pain, she was having some white vaginal discharge.  She took over-the-counter medications but she states she is still having some discharge.  Patient does report that she recently had a new sexual partner a few months ago.  She states they did use protection.  Patient denies any fevers, chest pain, difficulty breathing, vomiting, dysuria, hematuria.   The history is provided by the patient.    Past Medical History:  Diagnosis Date  . Endometriosis     There are no active problems to display for this patient.   History reviewed. No pertinent surgical history.   OB History   None      Home Medications    Prior  to Admission medications   Medication Sig Start Date End Date Taking? Authorizing Provider  benzocaine (ORAJEL) 10 % mucosal gel Use as directed 1 application in the mouth or throat as needed for mouth pain.    [provider]  benzonatate (TESSALON) 100 MG capsule Take 1 capsule (100 mg total) by mouth 3 (three) times daily as needed for cough. 02/05/18   Loren Racer, MD  cephALEXin (KEFLEX) 500 MG capsule Take 1 capsule (500 mg total) by mouth 2 (two) times daily for 7 days. 09/01/18 09/08/18  Maxwell Caul, PA-C  ibuprofen (ADVIL,MOTRIN) 800 MG tablet Take 1 tablet (800 mg total) by mouth every 8 (eight) hours as needed for mild pain. 06/07/15   Ward, Layla Maw, DO  loratadine (CLARITIN) 10 MG tablet Take 1 tablet (10 mg total) by mouth daily. 02/05/18   Loren Racer, MD  penicillin v potassium (VEETID) 500 MG tablet Take 1 tablet (500 mg total) by mouth 4 (four) times daily. 06/07/15   Ward, Layla Maw, DO  polyethylene glycol (MIRALAX) packet Take 17 g by mouth daily. 09/01/18   Maxwell Caul, PA-C  predniSONE (DELTASONE) 20 MG tablet 3 tabs po day one, then 2 po daily x 4 days 02/05/18   Loren Racer, MD  traMADol (ULTRAM) 50 MG tablet Take 1 tablet (50 mg total) by mouth every 6 (six) hours as needed. 06/07/15   Ward, Layla Maw, DO    Family History No family  history on file.  Social History Social History   Tobacco Use  . Smoking status: Former Games developer  . Smokeless tobacco: Never Used  Substance Use Topics  . Alcohol use: No  . Drug use: No     Allergies   Latex   Review of Systems Review of Systems  Constitutional: Negative for fever.  Respiratory: Negative for shortness of breath.   Cardiovascular: Negative for chest pain.  Gastrointestinal: Positive for abdominal pain, constipation and nausea. Negative for vomiting.  Genitourinary: Positive for vaginal discharge. Negative for dysuria, flank pain and vaginal bleeding.  All other systems reviewed and are  negative.    Physical Exam Updated Vital Signs BP 140/90 (BP Location: Right Arm)   Pulse 84   Temp 98.9 F (37.2 C) (Oral)   Resp 20   Ht 5\' 8"  (1.727 m)   Wt 109.8 kg   LMP 08/18/2018   SpO2 100%   BMI 36.80 kg/m   Physical Exam  Constitutional: She is oriented to person, place, and time. She appears well-developed and well-nourished.  Sitting comfortably on examination table  HENT:  Head: Normocephalic and atraumatic.  Mouth/Throat: Oropharynx is clear and moist and mucous membranes are normal.  Eyes: Pupils are equal, round, and reactive to light. Conjunctivae, EOM and lids are normal.  Neck: Full passive range of motion without pain.  Cardiovascular: Normal rate, regular rhythm, normal heart sounds and normal pulses. Exam reveals no gallop and no friction rub.  No murmur heard. Pulmonary/Chest: Effort normal and breath sounds normal.  Lungs clear to auscultation bilaterally.  Symmetric chest rise.  No wheezing, rales, rhonchi.  Abdominal: Soft. Normal appearance. There is generalized tenderness. There is no rigidity, no guarding, no CVA tenderness, no tenderness at McBurney's point and negative Murphy's sign.  Abdomen is soft, nondistended.  Diffuse tenderness noted throughout, slightly worse in the right upper quadrant, epigastric, left upper quadrant but with no true focal point.  No tenderness at McBurney's point.  Negative Murphy sign.  No CVA tenderness bilaterally.  Genitourinary: Cervix exhibits discharge. Cervix exhibits no motion tenderness and no friability. Right adnexum displays no mass and no tenderness. Left adnexum displays no mass and no tenderness. No bleeding in the vagina.  Genitourinary Comments: The exam was performed with a chaperone present. Normal external female genitalia. No lesions, rash, or sores.  Some white/yellow discharge noted.  No cervical friability.  No CMT.  No adnexal mass or tenderness bilaterally.  Musculoskeletal: Normal range of motion.   Neurological: She is alert and oriented to person, place, and time.  Skin: Skin is warm and dry. Capillary refill takes less than 2 seconds.  Psychiatric: She has a normal mood and affect. Her speech is normal.  Nursing note and vitals reviewed.    ED Treatments / Results  Labs (all labs ordered are listed, but only abnormal results are displayed) Labs Reviewed  WET PREP, GENITAL - Abnormal; Notable for the following components:      Result Value   WBC, Wet Prep HPF POC MANY (*)    All other components within normal limits  COMPREHENSIVE METABOLIC PANEL - Abnormal; Notable for the following components:   Calcium 8.7 (*)    AST 14 (*)    All other components within normal limits  CBC WITH DIFFERENTIAL/PLATELET - Abnormal; Notable for the following components:   WBC 12.9 (*)    Neutro Abs 9.9 (*)    Monocytes Absolute 1.1 (*)    All other components within normal limits  URINALYSIS, ROUTINE W REFLEX MICROSCOPIC - Abnormal; Notable for the following components:   Hgb urine dipstick MODERATE (*)    Bilirubin Urine SMALL (*)    Ketones, ur 15 (*)    Protein, ur 30 (*)    Leukocytes, UA TRACE (*)    All other components within normal limits  URINALYSIS, MICROSCOPIC (REFLEX) - Abnormal; Notable for the following components:   Bacteria, UA MANY (*)    All other components within normal limits  URINE CULTURE  LIPASE, BLOOD  PREGNANCY, URINE  GC/CHLAMYDIA PROBE AMP ([place]) NOT AT [facility]    EKG None  Radiology Dg Abdomen 1 View  Result Date: 09/01/2018 CLINICAL DATA:  Abdomen pain EXAM: ABDOMEN - 1 VIEW COMPARISON:  CT 02/05/2018 FINDINGS: Nonobstructed bowel gas pattern with moderate stool in the colon. No abnormal calcification. IMPRESSION: Nonobstructed bowel-gas pattern. Electronically Signed   By: Jasmine Pang M.D.   On: 09/01/2018 17:57    Procedures Procedures (including critical care time)  Medications Ordered in ED Medications  ketorolac (TORADOL) 30 MG/ML  injection 30 mg (30 mg Intravenous Given 09/01/18 1635)  cefTRIAXone (ROCEPHIN) injection 250 mg (250 mg Intramuscular Given 09/01/18 1831)  azithromycin (ZITHROMAX) tablet 1,000 mg (1,000 mg Oral Given 09/01/18 1830)     Initial Impression / Assessment and Plan / ED Course  I have reviewed the triage vital signs and the nursing notes.  Pertinent labs & imaging results that were available during my care of the patient were reviewed by me and considered in my medical decision making (see chart for details).     32 year old female who presents for evaluation of generalized abdominal pain that began yesterday.  Also reports some associated nausea.  4 days prior to abdominal pain, she has had some white vaginal discharge.  No urinary complaints, vomiting, fevers.  Reports history of constipation reports her last bowel movement was 5 days ago.  She is still passing flatus. Patient is afebrile, non-toxic appearing, sitting comfortably on examination table. Vital signs reviewed and stable.  On exam, patient with diffuse tenderness with no focal point.  No CVA tenderness.  No McBurney's point tenderness, Murphy sign.  No CVA tenderness.  Consider viral GI process versus UTI versus constipation.  History/physical exam is not concerning for hepatobiliary etiology, appendicitis, diverticulitis, ovarian torsion.  We will plan to check basic labs, analgesics provided in the ED.  CBC shows slight leukocytosis of 12.9.  CMP shows no abnormalities.  Lipase unremarkable.  UA shows moderate hemoglobin, small amount of protein, small amount of ketones, trace leukocytes.  It is appear to have amount of squamous epithelium so it may be contaminated.  Additionally, there is some granular casts and white blood cell casts.  Urine pregnancy is negative.  Pelvic exam as documented above.  Patient with some discharge noted.  No CMT that would be concerning for PID.  Abdominal x-ray showed moderate stool in colon.  Nonobstructive  bowel gas pattern.  Discussed results with patient.  She reports improvement in pain after analgesics.  Repeat exam is benign.  I discussed further management of patient's symptoms here in the ED.  We discussed at length regarding further CT and pelvis imaging.  I discussed with patient that given her reassuring work-up, reassuring exam, there is no indication for CT abdomen pelvis but I did offer one if patient would be more comfortable.  Patient declined CT abdomen pelvis at this time.  We will plan to p.o. challenge.  Patient able to tolerate p.o.  without any difficulty.  Repeat abdominal exam shows no tenderness.  Vital signs are stable.  I suspect that this may likely be due to constipation.  Plan to treat for UTI given that she is symptomatic.  Urine culture sent.  Usually, will plan to treat for constipation.  Patient wanted to be treated for STDs today as she reports that she normally does not have vaginal discharge.  Patient with no known drug allergies.  Current primary care follow-up. Patient had ample opportunity for questions and discussion. All patient's questions were answered with full understanding. Strict return precautions discussed. Patient expresses understanding and agreement to plan.   Final Clinical Impressions(s) / ED Diagnoses   Final diagnoses:  Acute cystitis with hematuria  Constipation, unspecified constipation type    ED Discharge Orders         Ordered    cephALEXin (KEFLEX) 500 MG capsule  2 times daily     09/01/18 1849    polyethylene glycol (MIRALAX) packet  Daily,   Status:  Discontinued     09/01/18 1849    polyethylene glycol (MIRALAX) packet  Daily     09/01/18 1851           [place], Quarry, PA-C 09/01/18 1933    Little, Ambrose Finland, MD 09/03/18 [phone]

## 2018-09-01 NOTE — Discharge Instructions (Signed)
You can take Tylenol or Ibuprofen as directed for pain. You can alternate Tylenol and Ibuprofen every 4 hours. If you take Tylenol at 1pm, then you can take Ibuprofen at 5pm. Then you can take Tylenol again at 9pm.   Take antibiotics as directed. Please take all of your antibiotics until finished.  Take MiraLAX as directed.  As we discussed, you were tested today for gonorrhea and chlamydia.  You have opted to you have treatment today.  He will get notified of your results.  If they are negative, you will not hear a call.  Return to emergency department for any worsening pain, pain that localizes to one specific point, persistent nausea/vomiting, fever or any other worsening or concerning symptoms.

## 2018-09-01 NOTE — ED Triage Notes (Signed)
C/o abd pain started yesterday-NAD-steady gait

## 2018-09-02 LAB — GC/CHLAMYDIA PROBE AMP (~~LOC~~) NOT AT ARMC
Chlamydia: POSITIVE — AB
NEISSERIA GONORRHEA: NEGATIVE

## 2018-09-03 LAB — URINE CULTURE

## 2019-10-04 ENCOUNTER — Emergency Department (HOSPITAL_BASED_OUTPATIENT_CLINIC_OR_DEPARTMENT_OTHER)
Admission: EM | Admit: 2019-10-04 | Discharge: 2019-10-04 | Disposition: A | Discharge status: Home / Self Care | Payer: Self-pay | Attending: Emergency Medicine | Admitting: Emergency Medicine

## 2019-10-04 ENCOUNTER — Emergency Department (HOSPITAL_BASED_OUTPATIENT_CLINIC_OR_DEPARTMENT_OTHER): Payer: Self-pay

## 2019-10-04 ENCOUNTER — Encounter (HOSPITAL_BASED_OUTPATIENT_CLINIC_OR_DEPARTMENT_OTHER): Payer: Self-pay | Admitting: Emergency Medicine

## 2019-10-04 ENCOUNTER — Other Ambulatory Visit: Payer: Self-pay

## 2019-10-04 DIAGNOSIS — S93401A Sprain of unspecified ligament of right ankle, initial encounter: Secondary | ICD-10-CM | POA: Insufficient documentation

## 2019-10-04 DIAGNOSIS — Z87891 Personal history of nicotine dependence: Secondary | ICD-10-CM | POA: Insufficient documentation

## 2019-10-04 DIAGNOSIS — Y92828 Other wilderness area as the place of occurrence of the external cause: Secondary | ICD-10-CM | POA: Insufficient documentation

## 2019-10-04 DIAGNOSIS — X500XXA Overexertion from strenuous movement or load, initial encounter: Secondary | ICD-10-CM | POA: Insufficient documentation

## 2019-10-04 DIAGNOSIS — S92354A Nondisplaced fracture of fifth metatarsal bone, right foot, initial encounter for closed fracture: Secondary | ICD-10-CM | POA: Insufficient documentation

## 2019-10-04 DIAGNOSIS — Y9389 Activity, other specified: Secondary | ICD-10-CM | POA: Insufficient documentation

## 2019-10-04 DIAGNOSIS — Z91040 Latex allergy status: Secondary | ICD-10-CM | POA: Insufficient documentation

## 2019-10-04 DIAGNOSIS — Z79899 Other long term (current) drug therapy: Secondary | ICD-10-CM | POA: Insufficient documentation

## 2019-10-04 DIAGNOSIS — Y999 Unspecified external cause status: Secondary | ICD-10-CM | POA: Insufficient documentation

## 2019-10-04 NOTE — ED Triage Notes (Signed)
Pt twisted her ankle and fell while walking on a trail. Swelling noted to R foot and ankle

## 2019-10-04 NOTE — ED Provider Notes (Signed)
[facility] EMERGENCY DEPARTMENT Provider Note   CSN: [id number] Arrival date & time: 10/04/19  1537     History   Chief Complaint Chief Complaint  Patient presents with  . Fall    HPI Adrienne Torres is a [age] y.o. female.     33 year old female presents with complaint of pain in her right foot and ankle.  Patient was sitting by a creek today when she was startled by a snake and tried to step away quickly causing her to roll her right ankle.  Patient felt a crunch in her right foot.  Patient was able to bear weight after the injury, reports pain and swelling to her right lateral foot and ankle.  No other injuries, complaints concerns.     Past Medical History:  Diagnosis Date  . Endometriosis     There are no active problems to display for this patient.   History reviewed. No pertinent surgical history.   OB History   No obstetric history on file.      Home Medications    Prior to Admission medications   Medication Sig Start Date End Date Taking? Authorizing Provider  benzocaine (ORAJEL) 10 % mucosal gel Use as directed 1 application in the mouth or throat as needed for mouth pain.    [provider]  benzonatate (TESSALON) 100 MG capsule Take 1 capsule (100 mg total) by mouth 3 (three) times daily as needed for cough. 02/05/18   Loren RacerYelverton, David, MD  ibuprofen (ADVIL,MOTRIN) 800 MG tablet Take 1 tablet (800 mg total) by mouth every 8 (eight) hours as needed for mild pain. 06/07/15   Ward, Layla MawKristen N, DO  loratadine (CLARITIN) 10 MG tablet Take 1 tablet (10 mg total) by mouth daily. 02/05/18   Loren RacerYelverton, David, MD  penicillin v potassium (VEETID) 500 MG tablet Take 1 tablet (500 mg total) by mouth 4 (four) times daily. 06/07/15   Ward, Layla MawKristen N, DO  polyethylene glycol (MIRALAX) packet Take 17 g by mouth daily. 09/01/18   Maxwell CaulLayden, Tenisha A, PA-C  predniSONE (DELTASONE) 20 MG tablet 3 tabs po day one, then 2 po daily x 4 days 02/05/18   Loren RacerYelverton, David, MD   traMADol (ULTRAM) 50 MG tablet Take 1 tablet (50 mg total) by mouth every 6 (six) hours as needed. 06/07/15   Ward, Layla MawKristen N, DO    Family History No family history on file.  Social History Social History   Tobacco Use  . Smoking status: Former Games developermoker  . Smokeless tobacco: Never Used  Substance Use Topics  . Alcohol use: No  . Drug use: No     Allergies   Latex   Review of Systems Review of Systems  Constitutional: Negative for fever.  Musculoskeletal: Positive for arthralgias, gait problem, joint swelling and myalgias.  Skin: Negative for color change, rash and wound.  Allergic/Immunologic: Negative for immunocompromised state.  Neurological: Negative for weakness and numbness.  All other systems reviewed and are negative.    Physical Exam Updated Vital Signs BP (!) 170/111 (BP Location: Left Arm)   Pulse 72   Temp 98.3 F (36.8 C) (Oral)   Resp 14   Ht 5\' 8"  (1.727 m)   Wt 108.4 kg   LMP 09/25/2019   SpO2 100%   BMI 36.34 kg/m   Physical Exam Vitals signs and nursing note reviewed.  Constitutional:      General: She is not in acute distress.    Appearance: She is well-developed. She is  not diaphoretic.  HENT:     Head: Normocephalic and atraumatic.  Cardiovascular:     Pulses: Normal pulses.  Pulmonary:     Effort: Pulmonary effort is normal.  Musculoskeletal:        General: Swelling and tenderness present. No deformity.     Right knee: Normal.     Right ankle: She exhibits decreased range of motion and swelling. She exhibits no ecchymosis, no deformity, no laceration and normal pulse. Tenderness. Lateral malleolus, medial malleolus and head of 5th metatarsal tenderness found. No proximal fibula tenderness found.     Right foot: Decreased range of motion. Normal capillary refill. Tenderness, bony tenderness and swelling present. No crepitus, deformity or laceration.       Feet:  Skin:    General: Skin is warm and dry.     Findings: No erythema or  rash.  Neurological:     Mental Status: She is alert and oriented to person, place, and time.  Psychiatric:        Behavior: Behavior normal.      ED Treatments / Results  Labs (all labs ordered are listed, but only abnormal results are displayed) Labs Reviewed - No data to display  EKG None  Radiology Dg Ankle Complete Right  Result Date: 10/04/2019 CLINICAL DATA:  Fall EXAM: RIGHT ANKLE - COMPLETE 3+ VIEW COMPARISON:  None. FINDINGS: No evidence of ankle fracture.  Os navicularis. The ankle mortise is intact. Fracture involving the base of the 5th metatarsal, better evaluated on dedicated foot radiographs. IMPRESSION: Fracture involving the base of the 5th metatarsal, better evaluated on dedicated foot radiographs. Electronically Signed   By: Charline Bills M.D.   On: 10/04/2019 16:34   Dg Foot Complete Right  Result Date: 10/04/2019 CLINICAL DATA:  Fall EXAM: RIGHT FOOT COMPLETE - 3+ VIEW COMPARISON:  None. FINDINGS: Nondisplaced fracture involving the base of the 5th metatarsal. The joint spaces are preserved. Visualized soft tissues are within normal limits. IMPRESSION: Nondisplaced fracture involving the base of the 5th metatarsal. Electronically Signed   By: Charline Bills M.D.   On: 10/04/2019 16:34    Procedures Procedures (including critical care time)  Medications Ordered in ED Medications - No data to display   Initial Impression / Assessment and Plan / ED Course  I have reviewed the triage vital signs and the nursing notes.  Pertinent labs & imaging results that were available during my care of the patient were reviewed by me and considered in my medical decision making (see chart for details).  Clinical Course as of Oct 03 1737  Tue Oct 04, 2019  1730 33yo female with right foot and ankle pain after rolling her ankle today. Pain to the medial and lateral right ankle with pain and swelling along the 5th metatarsal. Neuro vascularly intact distal to the  injury.  X-ray shows nondisplaced proximal fifth metatarsal fracture. Patient will be placed in a cam walker with crutches, weight-bear as tolerated and follow-up with orthopedics.  Recommend ice and elevate, Motrin Tylenol as instructed for pain.  Patient declines narcotic pain medication today.   [LM]    Clinical Course User Index [LM] Jeannie Fend, PA-C      Final Clinical Impressions(s) / ED Diagnoses   Final diagnoses:  Closed nondisplaced fracture of fifth metatarsal bone of right foot, initial encounter  Sprain of right ankle, unspecified ligament, initial encounter    ED Discharge Orders    None       Army Melia  A, PA-C 10/04/19 1738    Dorie Rank, MD 10/05/19 [phone]

## 2019-10-04 NOTE — Discharge Instructions (Signed)
Wear boot, follow up with orthopedics. Non weight bearing until cleared by orthopedics. Apply ice and elevate for 30 minutes at a time. Motrin and Tylenol as needed as directed for pain.

## 2019-11-16 ENCOUNTER — Emergency Department (HOSPITAL_BASED_OUTPATIENT_CLINIC_OR_DEPARTMENT_OTHER)
Admission: EM | Admit: 2019-11-16 | Discharge: 2019-11-16 | Disposition: A | Discharge status: Home / Self Care | Payer: Self-pay | Attending: Emergency Medicine | Admitting: Emergency Medicine

## 2019-11-16 ENCOUNTER — Encounter (HOSPITAL_BASED_OUTPATIENT_CLINIC_OR_DEPARTMENT_OTHER): Payer: Self-pay

## 2019-11-16 ENCOUNTER — Emergency Department (HOSPITAL_BASED_OUTPATIENT_CLINIC_OR_DEPARTMENT_OTHER): Payer: Self-pay

## 2019-11-16 ENCOUNTER — Other Ambulatory Visit: Payer: Self-pay

## 2019-11-16 DIAGNOSIS — Y939 Activity, unspecified: Secondary | ICD-10-CM | POA: Insufficient documentation

## 2019-11-16 DIAGNOSIS — Z91040 Latex allergy status: Secondary | ICD-10-CM | POA: Insufficient documentation

## 2019-11-16 DIAGNOSIS — S93401A Sprain of unspecified ligament of right ankle, initial encounter: Secondary | ICD-10-CM | POA: Insufficient documentation

## 2019-11-16 DIAGNOSIS — Y929 Unspecified place or not applicable: Secondary | ICD-10-CM | POA: Insufficient documentation

## 2019-11-16 DIAGNOSIS — W19XXXA Unspecified fall, initial encounter: Secondary | ICD-10-CM | POA: Insufficient documentation

## 2019-11-16 DIAGNOSIS — Y999 Unspecified external cause status: Secondary | ICD-10-CM | POA: Insufficient documentation

## 2019-11-16 DIAGNOSIS — Z87891 Personal history of nicotine dependence: Secondary | ICD-10-CM | POA: Insufficient documentation

## 2019-11-16 DIAGNOSIS — S92351G Displaced fracture of fifth metatarsal bone, right foot, subsequent encounter for fracture with delayed healing: Secondary | ICD-10-CM | POA: Insufficient documentation

## 2019-11-16 DIAGNOSIS — M7989 Other specified soft tissue disorders: Secondary | ICD-10-CM

## 2019-11-16 MED ORDER — NAPROXEN 500 MG PO TABS: 500.0000 mg | ORAL_TABLET | Freq: Two times a day (BID) | ORAL | 0 refills | Status: AC

## 2019-11-16 NOTE — ED Triage Notes (Signed)
Pt states she fell 11/13-c/o pain, discoloration to right LE since fall-states she has been wearing "the boot" she was given here 10/6 for fx foot-pt states she has not had ortho f/u after foot fx-pt NAD-limping gait

## 2019-11-16 NOTE — ED Provider Notes (Signed)
[facility] EMERGENCY DEPARTMENT Provider Note   CSN: [id number] Arrival date & time: 11/16/19  1949     History   Chief Complaint Chief Complaint  Patient presents with   Leg Problem    HPI Adrienne Torres is a 33 y.o. female.     Patient with a fall and injury on October 6 and was known to have 1/5 metatarsal fracture on her right foot. Referred to Ortho for follow-up she was in a boot. But she has not followed up. Patient had another fall on November 13. Complaining of discoloration swelling to the ankle and some discoloration of the leg. Pain at the foot ankle and shooting up to the leg. Patient has removed her boot because of the swelling and is now using crutches. No other injuries into the right leg.     Past Medical History:  Diagnosis Date   Endometriosis     There are no active problems to display for this patient.   History reviewed. No pertinent surgical history.   OB History   No obstetric history on file.      Home Medications    Prior to Admission medications   Medication Sig Start Date End Date Taking? Authorizing Provider  benzocaine (ORAJEL) 10 % mucosal gel Use as directed 1 application in the mouth or throat as needed for mouth pain.    [provider]  benzonatate (TESSALON) 100 MG capsule Take 1 capsule (100 mg total) by mouth 3 (three) times daily as needed for cough. 02/05/18   Julianne Rice, MD  ibuprofen (ADVIL,MOTRIN) 800 MG tablet Take 1 tablet (800 mg total) by mouth every 8 (eight) hours as needed for mild pain. 06/07/15   Ward, Delice Bison, DO  loratadine (CLARITIN) 10 MG tablet Take 1 tablet (10 mg total) by mouth daily. 02/05/18   Julianne Rice, MD  naproxen (NAPROSYN) 500 MG tablet Take 1 tablet (500 mg total) by mouth 2 (two) times daily. 11/16/19   Fredia Sorrow, MD  penicillin v potassium (VEETID) 500 MG tablet Take 1 tablet (500 mg total) by mouth 4 (four) times daily. 06/07/15   Ward, Delice Bison, DO    polyethylene glycol (MIRALAX) packet Take 17 g by mouth daily. 09/01/18   Volanda Napoleon, PA-C  predniSONE (DELTASONE) 20 MG tablet 3 tabs po day one, then 2 po daily x 4 days 02/05/18   Julianne Rice, MD  traMADol (ULTRAM) 50 MG tablet Take 1 tablet (50 mg total) by mouth every 6 (six) hours as needed. 06/07/15   Ward, Delice Bison, DO    Family History No family history on file.  Social History Social History   Tobacco Use   Smoking status: Former Smoker   Smokeless tobacco: Never Used  Substance Use Topics   Alcohol use: No   Drug use: No     Allergies   Latex   Review of Systems Review of Systems  Constitutional: Negative for chills and fever.  HENT: Negative for rhinorrhea and sore throat.   Eyes: Negative for visual disturbance.  Respiratory: Negative for cough and shortness of breath.   Cardiovascular: Negative for chest pain and leg swelling.  Gastrointestinal: Negative for abdominal pain, diarrhea, nausea and vomiting.  Genitourinary: Negative for dysuria.  Musculoskeletal: Negative for back pain and neck pain.  Skin: Negative for rash.  Neurological: Negative for dizziness, light-headedness and headaches.  Hematological: Does not bruise/bleed easily.  Psychiatric/Behavioral: Negative for confusion.     Physical Exam Updated Vital  Signs BP (!) 118/107 (BP Location: Right Arm)    Pulse 86    Temp 98.1 F (36.7 C) (Oral)    Resp 18    Ht 1.727 m (5\' 8" )    Wt 115.2 kg    LMP 11/15/2019    BMI 38.62 kg/m   Physical Exam   ED Treatments / Results  Labs (all labs ordered are listed, but only abnormal results are displayed) Labs Reviewed - No data to display  EKG None  Radiology Dg Tibia/fibula Right  Result Date: 11/16/2019 CLINICAL DATA:  33 year old female status post fall 5 weeks ago with nondisplaced fracture of the base of the right 5th metatarsal. Continued pain. EXAM: RIGHT TIBIA AND FIBULA - 2 VIEW COMPARISON:  Right ankle series  10/04/2019. FINDINGS: Bone mineralization is within normal limits. Abnormal 5th metatarsal, reported separately today. Intact right tibia and fibula with no other evidence of fracture or other focal bone lesions. Mild soft tissue swelling, including along the medial distal leg. IMPRESSION: 1.  No acute osseous abnormality identified in the right tib-fib. 2. Abnormal right 5th metatarsal reported separately. 3. Soft tissue swelling in the distal lower extremity. Electronically Signed   By: Odessa FlemingH  Hall M.D.   On: 11/16/2019 23:00   Dg Ankle Complete Right  Result Date: 11/16/2019 CLINICAL DATA:  33 year old female status post fall 5 weeks ago with nondisplaced fracture of the base of the right 5th metatarsal. Continued pain. EXAM: RIGHT ANKLE - COMPLETE 3+ VIEW COMPARISON:  Right foot series today. Right ankle series 10/04/2019. FINDINGS: Abnormal right 5th metatarsal reported separately. Preserved right mortise joint alignment. Talar dome intact. Distal tibia, fibula, and calcaneus intact. Stable calcaneus degenerative spurring. Increased soft tissue swelling. IMPRESSION: 1. Abnormal right 5th metatarsal, see foot series. 2. Right ankle remains intact.  Increased soft tissue swelling. Electronically Signed   By: Odessa FlemingH  Hall M.D.   On: 11/16/2019 23:03   Dg Foot Complete Right  Result Date: 11/16/2019 CLINICAL DATA:  33 year old female status post fall 5 weeks ago with nondisplaced fracture of the base of the right 5th metatarsal. Continued pain. EXAM: RIGHT FOOT COMPLETE - 3+ VIEW COMPARISON:  Right foot series 10/04/2019. FINDINGS: Increased soft tissue swelling about the right foot. Joint spaces are maintained. Un healed mildly comminuted but still minimally displaced fracture at the base of the 5th metatarsal. Increased fracture lucency since October. Small plantar linear periosteal new bone formation or less likely comminution fragment. Other osseous structures appear stable and intact. Accessory ossicle along  the posterior navicular. IMPRESSION: 1. Unhealed but still minimally displaced fracture at the base of the right 5th metatarsal. 2. Increased soft tissue swelling since October. 3. No new osseous abnormality. Electronically Signed   By: Odessa FlemingH  Hall M.D.   On: 11/16/2019 23:02    Procedures Procedures (including critical care time)  Medications Ordered in ED Medications - No data to display   Initial Impression / Assessment and Plan / ED Course  I have reviewed the triage vital signs and the nursing notes.  Pertinent labs & imaging results that were available during my care of the patient were reviewed by me and considered in my medical decision making (see chart for details).        Patient still with nonhealing fifth metatarsal fracture. Told patient she needs to wear her boot. But in the meantime since she says is too tight with the swelling will have her use crutches follow-up with sports medicine. X-rays of the ankle and tib-fib without  any new bony injuries. So would represent ankle sprain. Also due to all the swelling and discoloration will have patient come back tomorrow and get Doppler study of the leg to rule out deep vein thrombosis. We will treat the patient with Naprosyn.  Final Clinical Impressions(s) / ED Diagnoses   Final diagnoses:  Sprain of right ankle, unspecified ligament, initial encounter  Right leg swelling  Closed displaced fracture of fifth metatarsal bone of right foot with delayed healing, subsequent encounter    ED Discharge Orders         Ordered    naproxen (NAPROSYN) 500 MG tablet  2 times daily     11/16/19 2321           Vanetta Mulders, MD 11/16/19 [phone]

## 2019-11-16 NOTE — ED Notes (Signed)
Pt states broken r foot x 1 month ago, boxes recreationally and was fell x 1 week ago. r ankle swollen with 'knot' on inner r shin area. Shin tender to touch and pulsing pain to area. States discoloration around ankle area.

## 2019-11-16 NOTE — Discharge Instructions (Addendum)
When able to start weightbearing we wear your boot.  In the meantime recommend just using the crutches and staying off the leg.  Make an appointment to follow-up with sports medicine.  Return tomorrow to have ultrasound done of the right lower extremity to rule out a blood clot.  Take the Naprosyn as directed for pain.

## 2019-11-17 ENCOUNTER — Ambulatory Visit (HOSPITAL_BASED_OUTPATIENT_CLINIC_OR_DEPARTMENT_OTHER)
Admission: RE | Admit: 2019-11-17 | Discharge: 2019-11-17 | Disposition: A | Discharge status: Home / Self Care | Payer: Self-pay | Source: Ambulatory Visit | Attending: Student | Admitting: Student

## 2019-11-17 ENCOUNTER — Telehealth (HOSPITAL_BASED_OUTPATIENT_CLINIC_OR_DEPARTMENT_OTHER): Payer: Self-pay | Admitting: Student

## 2019-11-17 ENCOUNTER — Inpatient Hospital Stay (HOSPITAL_BASED_OUTPATIENT_CLINIC_OR_DEPARTMENT_OTHER): Admit: 2019-11-17 | Payer: Self-pay

## 2019-11-17 DIAGNOSIS — M79604 Pain in right leg: Secondary | ICD-10-CM | POA: Insufficient documentation

## 2019-11-17 NOTE — Telephone Encounter (Signed)
Patient was set up for an outpatient DVT study after being seen in the ED yesterday, here for ultrasound, issues with order, order for outpatient right lower extremity DVT study placed

## 2019-11-24 ENCOUNTER — Other Ambulatory Visit (HOSPITAL_BASED_OUTPATIENT_CLINIC_OR_DEPARTMENT_OTHER): Payer: Self-pay

## 2019-11-24 ENCOUNTER — Other Ambulatory Visit: Payer: Self-pay

## 2019-11-24 ENCOUNTER — Emergency Department (HOSPITAL_BASED_OUTPATIENT_CLINIC_OR_DEPARTMENT_OTHER): Payer: Self-pay

## 2019-11-24 ENCOUNTER — Emergency Department (HOSPITAL_BASED_OUTPATIENT_CLINIC_OR_DEPARTMENT_OTHER)
Admission: EM | Admit: 2019-11-24 | Discharge: 2019-11-24 | Disposition: A | Discharge status: Home / Self Care | Payer: Self-pay | Attending: Emergency Medicine | Admitting: Emergency Medicine

## 2019-11-24 ENCOUNTER — Encounter (HOSPITAL_BASED_OUTPATIENT_CLINIC_OR_DEPARTMENT_OTHER): Payer: Self-pay | Admitting: Emergency Medicine

## 2019-11-24 DIAGNOSIS — M7989 Other specified soft tissue disorders: Secondary | ICD-10-CM

## 2019-11-24 DIAGNOSIS — R0789 Other chest pain: Secondary | ICD-10-CM | POA: Insufficient documentation

## 2019-11-24 DIAGNOSIS — Z91040 Latex allergy status: Secondary | ICD-10-CM | POA: Insufficient documentation

## 2019-11-24 DIAGNOSIS — Z87891 Personal history of nicotine dependence: Secondary | ICD-10-CM | POA: Insufficient documentation

## 2019-11-24 DIAGNOSIS — R0602 Shortness of breath: Secondary | ICD-10-CM | POA: Insufficient documentation

## 2019-11-24 DIAGNOSIS — R2241 Localized swelling, mass and lump, right lower limb: Secondary | ICD-10-CM | POA: Insufficient documentation

## 2019-11-24 DIAGNOSIS — Z79899 Other long term (current) drug therapy: Secondary | ICD-10-CM | POA: Insufficient documentation

## 2019-11-24 LAB — CBC WITH DIFFERENTIAL/PLATELET
Abs Immature Granulocytes: 0.01 10*3/uL (ref 0.00–0.07)
Basophils Absolute: 0 10*3/uL (ref 0.0–0.1)
Basophils Relative: 0 %
Eosinophils Absolute: 0.2 10*3/uL (ref 0.0–0.5)
Eosinophils Relative: 2 %
HCT: 39 % (ref 36.0–46.0)
Hemoglobin: 13.1 g/dL (ref 12.0–15.0)
Immature Granulocytes: 0 %
Lymphocytes Relative: 26 %
Lymphs Abs: 2.5 10*3/uL (ref 0.7–4.0)
MCH: 30.1 pg (ref 26.0–34.0)
MCHC: 33.6 g/dL (ref 30.0–36.0)
MCV: 89.7 fL (ref 80.0–100.0)
Monocytes Absolute: 0.7 10*3/uL (ref 0.1–1.0)
Monocytes Relative: 7 %
Neutro Abs: 6.3 10*3/uL (ref 1.7–7.7)
Neutrophils Relative %: 65 %
Platelets: 304 10*3/uL (ref 150–400)
RBC: 4.35 MIL/uL (ref 3.87–5.11)
RDW: 12.3 % (ref 11.5–15.5)
WBC: 9.7 10*3/uL (ref 4.0–10.5)
nRBC: 0 % (ref 0.0–0.2)

## 2019-11-24 LAB — BASIC METABOLIC PANEL
Anion gap: 7 (ref 5–15)
BUN: 12 mg/dL (ref 6–20)
CO2: 21 mmol/L — ABNORMAL LOW (ref 22–32)
Calcium: 8.8 mg/dL — ABNORMAL LOW (ref 8.9–10.3)
Chloride: 109 mmol/L (ref 98–111)
Creatinine, Ser: 0.87 mg/dL (ref 0.44–1.00)
GFR calc Af Amer: >60 mL/min (ref >60)
GFR calc non Af Amer: >60 mL/min (ref >60)
Glucose, Bld: 99 mg/dL (ref 70–99)
Potassium: 3.4 mmol/L — ABNORMAL LOW (ref 3.5–5.1)
Sodium: 137 mmol/L (ref 135–145)

## 2019-11-24 LAB — TROPONIN I (HIGH SENSITIVITY): Troponin I (High Sensitivity): <2 ng/L (ref <18)

## 2019-11-24 LAB — D-DIMER, QUANTITATIVE: D-Dimer, Quant: 0.39 ug/mL-FEU (ref 0.00–0.50)

## 2019-11-24 MED ORDER — OXYCODONE-ACETAMINOPHEN 5-325 MG PO TABS
1.0000 | ORAL_TABLET | Freq: Once | ORAL | Status: AC
  Administered 2019-11-24: 1 via ORAL
  Filled 2019-11-24: qty 1

## 2019-11-24 NOTE — Discharge Instructions (Signed)
Please call the sports medicine specialist tomorrow to schedule close follow-up appointment regarding the symptoms you are experiencing.  Recommend continuing the naproxen as well as Tylenol for pain control.  If you develop worsening chest pain, any difficulty breathing or other new concerns symptom recommend return to ER for reassessment.  Regarding your elevated blood pressure, please call your primary care doctor to follow up and have your blood pressure rechecked in the office and discuss initiating medicine if appropriate.

## 2019-11-24 NOTE — ED Triage Notes (Signed)
She fell a few times since October and had a R foot injury. C/o ongoing pain and swelling with a new painful knot to R calf.

## 2019-11-24 NOTE — ED Provider Notes (Signed)
[facility] EMERGENCY DEPARTMENT Provider Note   CSN: [id number] Arrival date & time: 11/24/19  1432     History   Chief Complaint Chief Complaint  Patient presents with  . Leg Pain    HPI Adrienne Torres is a 33 y.o. female.  Presents emerged from chief complaints of fall right leg pain, swelling.  Patient states she had an injury in October, then fell again a couple weeks ago.  Has been seen in our emergency department multiple times.  Initially diagnosed with fracture at base of fifth metatarsal.  Had not follow-up with orthopedic surgery as previously recommended.  She was using the boot initially, but stopped doing this due to pain in her right leg.  States over the past week or so she has noted swelling in her right calf.  Concerned about possibility of blood clot.  Ultrasound performed few days ago was negative for DVT in her right lower extremity.  Since yesterday she is also been having intermittent episodes of chest tightness, feeling more short of breath than normal and having elevated blood pressures.  Seems to come and go at random, not associated with exertion, moderate in severity, pressure, achy pain.  Not currently having it right now.  No new injury since last ER visit. Has continued to use crutches, naproxen and intermittent icing.      HPI  Past Medical History:  Diagnosis Date  . Endometriosis     There are no active problems to display for this patient.   History reviewed. No pertinent surgical history.   OB History   No obstetric history on file.      Home Medications    Prior to Admission medications   Medication Sig Start Date End Date Taking? Authorizing Provider  benzocaine (ORAJEL) 10 % mucosal gel Use as directed 1 application in the mouth or throat as needed for mouth pain.    [provider]  benzonatate (TESSALON) 100 MG capsule Take 1 capsule (100 mg total) by mouth 3 (three) times daily as needed for cough. 02/05/18    Loren RacerYelverton, David, MD  ibuprofen (ADVIL,MOTRIN) 800 MG tablet Take 1 tablet (800 mg total) by mouth every 8 (eight) hours as needed for mild pain. 06/07/15   Ward, Layla MawKristen N, DO  loratadine (CLARITIN) 10 MG tablet Take 1 tablet (10 mg total) by mouth daily. 02/05/18   Loren RacerYelverton, David, MD  naproxen (NAPROSYN) 500 MG tablet Take 1 tablet (500 mg total) by mouth 2 (two) times daily. 11/16/19   Vanetta MuldersZackowski, Scott, MD  penicillin v potassium (VEETID) 500 MG tablet Take 1 tablet (500 mg total) by mouth 4 (four) times daily. 06/07/15   Ward, Layla MawKristen N, DO  polyethylene glycol (MIRALAX) packet Take 17 g by mouth daily. 09/01/18   Maxwell CaulLayden, Jerica A, PA-C  predniSONE (DELTASONE) 20 MG tablet 3 tabs po day one, then 2 po daily x 4 days 02/05/18   Loren RacerYelverton, David, MD  traMADol (ULTRAM) 50 MG tablet Take 1 tablet (50 mg total) by mouth every 6 (six) hours as needed. 06/07/15   Ward, Layla MawKristen N, DO    Family History No family history on file.  Social History Social History   Tobacco Use  . Smoking status: Former Games developermoker  . Smokeless tobacco: Never Used  Substance Use Topics  . Alcohol use: No  . Drug use: No     Allergies   Latex   Review of Systems Review of Systems  Constitutional: Negative for chills and fever.  HENT: Negative for ear pain and sore throat.   Eyes: Negative for pain and visual disturbance.  Respiratory: Positive for shortness of breath. Negative for cough.   Cardiovascular: Negative for chest pain and palpitations.  Gastrointestinal: Negative for abdominal pain and vomiting.  Genitourinary: Negative for dysuria and hematuria.  Musculoskeletal: Negative for arthralgias and back pain.  Skin: Negative for color change and rash.  Neurological: Negative for seizures and syncope.  All other systems reviewed and are negative.    Physical Exam Updated Vital Signs BP (!) 181/109 (BP Location: Right Arm)   Pulse (!) 101   Temp 99.3 F (37.4 C) (Oral)   Resp 20   Ht 5\' 8"  (1.727 m)    Wt 114.3 kg   LMP 11/15/2019   SpO2 100%   BMI 38.32 kg/m   Physical Exam Vitals signs and nursing note reviewed.  Constitutional:      General: She is not in acute distress.    Appearance: She is well-developed.  HENT:     Head: Normocephalic and atraumatic.  Eyes:     Conjunctiva/sclera: Conjunctivae normal.  Neck:     Musculoskeletal: Neck supple.  Cardiovascular:     Rate and Rhythm: Normal rate and regular rhythm.     Heart sounds: No murmur.  Pulmonary:     Effort: Pulmonary effort is normal. No respiratory distress.     Breath sounds: Normal breath sounds.  Abdominal:     Palpations: Abdomen is soft.     Tenderness: There is no abdominal tenderness.  Musculoskeletal:     Comments: Mild swelling over the right mid lower leg, no obvious deformity noted, distal pulses, sensation, motor intact  Skin:    General: Skin is warm and dry.  Neurological:     General: No focal deficit present.     Mental Status: She is alert and oriented to person, place, and time.  Psychiatric:        Mood and Affect: Mood normal.        Behavior: Behavior normal.      ED Treatments / Results  Labs (all labs ordered are listed, but only abnormal results are displayed) Labs Reviewed  BASIC METABOLIC PANEL - Abnormal; Notable for the following components:      Result Value   Potassium 3.4 (*)    CO2 21 (*)    Calcium 8.8 (*)    All other components within normal limits  D-DIMER, QUANTITATIVE (NOT AT [facility])  CBC WITH DIFFERENTIAL/PLATELET  TROPONIN I (HIGH SENSITIVITY)    EKG None  Radiology Dg Chest 2 View  Result Date: 11/24/2019 CLINICAL DATA:  She fell a few times since October and had a R foot injury. C/o ongoing pain and swelling with a new painful knot to R calf. EXAM: CHEST - 2 VIEW COMPARISON:  None. FINDINGS: The heart size and mediastinal contours are within normal limits. Both lungs are clear. No pleural effusion or pneumothorax. The visualized skeletal structures  are unremarkable. IMPRESSION: Normal chest radiographs. Electronically Signed   By: Lajean Manes M.D.   On: 11/24/2019 15:45    Procedures Procedures (including critical care time)  Medications Ordered in ED Medications  oxyCODONE-acetaminophen (PERCOCET/ROXICET) 5-325 MG per tablet 1 tablet (1 tablet Oral Given 11/24/19 1531)     Initial Impression / Assessment and Plan / ED Course  I have reviewed the triage vital signs and the nursing notes.  Pertinent labs & imaging results that were available during my care of the patient  were reviewed by me and considered in my medical decision making (see chart for details).        32 year old presented to ER with right mid calf pain as well as new onset of chest pressure and shortness of breath.  On exam patient is very well-appearing, normal lung exam, no tachypnea, no hypoxia.  Her EKG was normal, troponin undetectable, very low suspicion for ACS.  D-dimer negative, doubt PE.  Labs otherwise unremarkable.  Chest x-ray negative.  Regarding her leg pain, she has had no new falls since she was last evaluated in the ER.  At that time they read it x-rays which were negative for any new acute pathology and redemonstrated the healing fracture at base the fifth metatarsal.  She also had an ultrasound at that time which was negative for DVT but did demonstrate findings concerning for a hematoma.  Suspect this is still the case.  Low clinical suspicion for new DVT.  Recommended patient come back tomorrow for a confirmatory ultrasound study of her right lower extremity to rule out DVT.  Patient has not followed up yet with orthopedic surgery, recommended that she follow-up with them as soon as possible.     After the discussed management above, the patient was determined to be safe for discharge.  The patient was in agreement with this plan and all questions regarding their care were answered.  ED return precautions were discussed and the patient will return  to the ED with any significant worsening of condition.    Final Clinical Impressions(s) / ED Diagnoses   Final diagnoses:  Right leg swelling    ED Discharge Orders         Ordered    US Venous Img Lower Unilateral Right     11/24/19 1630           Milagros Loll, MD 11/24/19 1652

## 2019-11-24 NOTE — ED Notes (Signed)
ED Provider at bedside. 

## 2019-11-25 ENCOUNTER — Other Ambulatory Visit (HOSPITAL_BASED_OUTPATIENT_CLINIC_OR_DEPARTMENT_OTHER): Payer: Self-pay | Admitting: Emergency Medicine

## 2019-11-25 ENCOUNTER — Ambulatory Visit (HOSPITAL_BASED_OUTPATIENT_CLINIC_OR_DEPARTMENT_OTHER)
Admission: RE | Admit: 2019-11-25 | Discharge: 2019-11-25 | Disposition: A | Discharge status: Home / Self Care | Payer: Self-pay | Source: Ambulatory Visit | Attending: Emergency Medicine | Admitting: Emergency Medicine

## 2019-11-25 DIAGNOSIS — R600 Localized edema: Secondary | ICD-10-CM | POA: Insufficient documentation

## 2019-11-25 DIAGNOSIS — R52 Pain, unspecified: Secondary | ICD-10-CM

## 2019-11-29 ENCOUNTER — Other Ambulatory Visit: Payer: Self-pay

## 2019-11-29 ENCOUNTER — Ambulatory Visit (INDEPENDENT_AMBULATORY_CARE_PROVIDER_SITE_OTHER): Payer: Self-pay | Admitting: Family Medicine

## 2019-11-29 ENCOUNTER — Ambulatory Visit: Payer: Self-pay

## 2019-11-29 ENCOUNTER — Encounter: Payer: Self-pay | Admitting: Family Medicine

## 2019-11-29 VITALS — BP 152/97 | HR 88 | Ht 68.0 in | Wt 245.0 lb

## 2019-11-29 DIAGNOSIS — M25571 Pain in right ankle and joints of right foot: Secondary | ICD-10-CM

## 2019-11-29 DIAGNOSIS — S92351A Displaced fracture of fifth metatarsal bone, right foot, initial encounter for closed fracture: Secondary | ICD-10-CM

## 2019-11-29 DIAGNOSIS — T148XXA Other injury of unspecified body region, initial encounter: Secondary | ICD-10-CM

## 2019-11-29 DIAGNOSIS — S92353A Displaced fracture of fifth metatarsal bone, unspecified foot, initial encounter for closed fracture: Secondary | ICD-10-CM | POA: Insufficient documentation

## 2019-11-29 NOTE — Assessment & Plan Note (Signed)
Initial injury occurred on 10/6.  She had a reaggravation of said injury on 11/18.  Limited healing appreciated on scans thus far. -Short cam walker. -Counseled on supportive care. -Follow-up in 4 weeks.

## 2019-11-29 NOTE — Assessment & Plan Note (Signed)
Fairly large hematoma appreciated on ultrasound.  Consistent with injury on 11/18. -Counseled on compression and heat. -Counseled on supportive care.

## 2019-11-29 NOTE — Patient Instructions (Signed)
Nice to meet you Please try ice for the foot and ankle  Please try heat for the leg  Please try the range of motion exercises  Please send me a message in MyChart with any questions or updates.  Please see me back in 4 weeks.   --Dr. Raeford Razor

## 2019-11-29 NOTE — Assessment & Plan Note (Signed)
There appears to be an effusion as well as subcutaneous swelling in the medial compartment of the ankle.  The posterior tibialis is intact but it seems to be accompanying the tendon.  It is unlikely that the tendinitis as it may be more likely a retinacular tear or ankle sprain. -Cam walker. -Counseled on home exercise therapy and supportive care. -Can consider physical therapy.

## 2019-11-29 NOTE — Progress Notes (Signed)
Adrienne Torres - 33 y.o. female MRN [id number]  Date of birth: 26-Jun-1986  SUBJECTIVE:  Including CC & ROS.  Chief Complaint  Patient presents with  . Foot Pain    right foot/ankle    Adrienne Torres is a 33 y.o. female that is presenting with fracture of the foot as well as an ankle sprain and right lower leg pain.  She was initially hiking in October and had an injury.  She was treated in the emergency department and diagnosed with a fracture of the base of the fifth metatarsal.  She was in a cam walker during this time.  2 weeks ago she took the walker off and started working out.  She exacerbated this underlying fracture.  She fell on the right side and injured the medial aspect of her ankle as well as the proximal medial anterior aspect of the right lower leg.  She is not been wearing the cam walker because it hurts the proximal tibia when she wears it.  The pain is occurring in the medial ankle as well as the lateral foot and anterior proximal tibia.  She is having ecchymosis over the proximal anterior tibia..  Independent review of the right foot x-ray from 11/18 shows a Jones fracture of the fifth metatarsal.   Review of Systems  Constitutional: Negative for fever.  HENT: Negative for congestion.   Respiratory: Negative for cough.   Cardiovascular: Negative for chest pain.  Gastrointestinal: Negative for abdominal pain.  Musculoskeletal: Positive for back pain and gait problem.  Skin: Positive for color change.  Neurological: Negative for weakness.  Hematological: Negative for adenopathy.    HISTORY: Past Medical, Surgical, Social, and Family History Reviewed & Updated per EMR.   Pertinent Historical Findings include:  Past Medical History:  Diagnosis Date  . Endometriosis     No past surgical history on file.  Allergies  Allergen Reactions  . Latex Swelling    No family history on file.   Social History   Socioeconomic History  . Marital status: Single   Spouse name: Not on file  . Number of children: Not on file  . Years of education: Not on file  . Highest education level: Not on file  Occupational History  . Not on file  Social Needs  . Financial resource strain: Not on file  . Food insecurity    Worry: Not on file    Inability: Not on file  . Transportation needs    Medical: Not on file    Non-medical: Not on file  Tobacco Use  . Smoking status: Former Games developer  . Smokeless tobacco: Never Used  Substance and Sexual Activity  . Alcohol use: No  . Drug use: No  . Sexual activity: Not on file  Lifestyle  . Physical activity    Days per week: Not on file    Minutes per session: Not on file  . Stress: Not on file  Relationships  . Social Musician on phone: Not on file    Gets together: Not on file    Attends religious service: Not on file    Active member of club or organization: Not on file    Attends meetings of clubs or organizations: Not on file    Relationship status: Not on file  . Intimate partner violence    Fear of current or ex partner: Not on file    Emotionally abused: Not on file    Physically abused: Not  on file    Forced sexual activity: Not on file  Other Topics Concern  . Not on file  Social History Narrative  . Not on file     PHYSICAL EXAM:  VS: BP (!) 152/97   Pulse 88   Ht 5\' 8"  (1.727 m)   Wt 245 lb (111.1 kg)   LMP 11/15/2019   BMI 37.25 kg/m  Physical Exam Gen: NAD, alert, cooperative with exam, well-appearing ENT: normal lips, normal nasal mucosa,  Eye: normal EOM, normal conjunctiva and lids CV:  no edema, +2 pedal pulses   Resp: no accessory muscle use, non-labored,  Skin: no rashes, no areas of induration  Neuro: normal tone, normal sensation to touch Psych:  normal insight, alert and oriented MSK:  Right leg: Obvious hematoma over the anterior proximal tibia. Tenderness to palpation over this area. Improving ecchymosis over the anterior tibia. Right ankle/foot:  Tenderness to palpation over the base of the fifth. Normal ankle range of motion. Tenderness to palpation over the medial malleolus. Neurovascularly intact  Limited ultrasound: Right leg, right ankle, right foot:  Right foot: Fracture at the base of the fifth metatarsal appreciated. Normal insertion of the peroneal brevis into the fifth metatarsal. No other fracture of the shaft appreciated.  Right ankle: Posterior tibialis is intact. There appears to be an effusion accompanying the posterior tibialis as it courses inferior to the medial malleolus. This seems to track into the insertion of the navicular. No changes at the insertion of the posterior tibialis into the navicular.  Appears to be in an accessory navicular bone.  Right leg: Large hematoma appreciated in the subcutaneous tissue.  This is roughly 4 cm x 3.8 cm.  Summary: Fracture appreciated at the base of the fifth metatarsal.  There appears to an effusion associated with with the posterior tibialis but necessarily a tendinitis.  Large hematoma appreciated in the subcutaneous of the proximal anterior medial tibia  Ultrasound and interpretation by Clearance Coots, MD    ASSESSMENT & PLAN:   Closed fracture of base of fifth metatarsal bone Initial injury occurred on 10/6.  She had a reaggravation of said injury on 11/18.  Limited healing appreciated on scans thus far. -Short cam walker. -Counseled on supportive care. -Follow-up in 4 weeks.  Acute right ankle pain There appears to be an effusion as well as subcutaneous swelling in the medial compartment of the ankle.  The posterior tibialis is intact but it seems to be accompanying the tendon.  It is unlikely that the tendinitis as it may be more likely a retinacular tear or ankle sprain. -Cam walker. -Counseled on home exercise therapy and supportive care. -Can consider physical therapy.  Hematoma Fairly large hematoma appreciated on ultrasound.  Consistent with  injury on 11/18. -Counseled on compression and heat. -Counseled on supportive care.

## 2019-12-28 ENCOUNTER — Ambulatory Visit: Payer: Self-pay | Admitting: Family Medicine

## 2020-01-20 ENCOUNTER — Emergency Department (HOSPITAL_BASED_OUTPATIENT_CLINIC_OR_DEPARTMENT_OTHER): Payer: Self-pay

## 2020-01-20 ENCOUNTER — Emergency Department (HOSPITAL_BASED_OUTPATIENT_CLINIC_OR_DEPARTMENT_OTHER)
Admission: EM | Admit: 2020-01-20 | Discharge: 2020-01-20 | Disposition: A | Discharge status: Home / Self Care | Payer: Self-pay | Attending: Emergency Medicine | Admitting: Emergency Medicine

## 2020-01-20 ENCOUNTER — Encounter (HOSPITAL_BASED_OUTPATIENT_CLINIC_OR_DEPARTMENT_OTHER): Payer: Self-pay | Admitting: Emergency Medicine

## 2020-01-20 ENCOUNTER — Other Ambulatory Visit: Payer: Self-pay

## 2020-01-20 DIAGNOSIS — M5441 Lumbago with sciatica, right side: Secondary | ICD-10-CM | POA: Insufficient documentation

## 2020-01-20 DIAGNOSIS — Z87891 Personal history of nicotine dependence: Secondary | ICD-10-CM | POA: Insufficient documentation

## 2020-01-20 DIAGNOSIS — Z91040 Latex allergy status: Secondary | ICD-10-CM | POA: Insufficient documentation

## 2020-01-20 DIAGNOSIS — Z79899 Other long term (current) drug therapy: Secondary | ICD-10-CM | POA: Insufficient documentation

## 2020-01-20 MED ORDER — METHYLPREDNISOLONE 4 MG PO TBPK: ORAL_TABLET | ORAL | 0 refills | Status: DC

## 2020-01-20 MED ORDER — LIDOCAINE 5 % EX PTCH: 1.0000 | MEDICATED_PATCH | CUTANEOUS | 0 refills | Status: DC

## 2020-01-20 MED ORDER — HYDROCODONE-ACETAMINOPHEN 5-325 MG PO TABS
1.0000 | ORAL_TABLET | Freq: Once | ORAL | Status: AC
  Administered 2020-01-20: 1 via ORAL
  Filled 2020-01-20: qty 1

## 2020-01-20 NOTE — ED Provider Notes (Signed)
[facility] EMERGENCY DEPARTMENT Provider Note   CSN: [id number] Arrival date & time: 01/20/20  1037     History Chief Complaint  Patient presents with  . Back Pain    Adrienne Torres is a 34 y.o. female who presents to the ED today complaining of sudden onset, constant, 10/10, sharp, lower back pain radiating down RLE x 2 days.  Patient reports she was working out Wednesday and doing squats with a barbell over her back.  She states that while doing this her trainer added a 45 pound weight to the right side mid squat causing patient to fall and land directly onto her knees.  Patient states that bar fell onto her back and she felt immediate pain in her middle lower back shooting down her right extremity.  States that she has been taking ibuprofen and Tylenol at home without relief.  Has also been applying heat and over-the-counter cream with no relief.  Patient denies any fevers, chills, urinary retention, urinary or bowel incontinence, saddle anesthesia, any other associated symptoms.  No history of prolonged steroid use.  No history of IV drug use.   The history is provided by the patient.       Past Medical History:  Diagnosis Date  . Endometriosis     Patient Active Problem List   Diagnosis Date Noted  . Closed fracture of base of fifth metatarsal bone 11/29/2019  . Hematoma 11/29/2019  . Acute right ankle pain 11/29/2019    History reviewed. No pertinent surgical history.   OB History   No obstetric history on file.     No family history on file.  Social History   Tobacco Use  . Smoking status: Former Games developer  . Smokeless tobacco: Never Used  Substance Use Topics  . Alcohol use: No  . Drug use: No    Home Medications Prior to Admission medications   Medication Sig Start Date End Date Taking? Authorizing Provider  benzocaine (ORAJEL) 10 % mucosal gel Use as directed 1 application in the mouth or throat as needed for mouth pain.    [provider]  benzonatate (TESSALON) 100 MG capsule Take 1 capsule (100 mg total) by mouth 3 (three) times daily as needed for cough. 02/05/18   Loren Racer, MD  ibuprofen (ADVIL,MOTRIN) 800 MG tablet Take 1 tablet (800 mg total) by mouth every 8 (eight) hours as needed for mild pain. 06/07/15   Ward, Layla Maw, DO  lidocaine (LIDODERM) 5 % Place 1 patch onto the skin daily. Remove & Discard patch within 12 hours or as directed by MD 01/20/20   Tanda Rockers, PA-C  loratadine (CLARITIN) 10 MG tablet Take 1 tablet (10 mg total) by mouth daily. 02/05/18   Loren Racer, MD  methylPREDNISolone (MEDROL DOSEPAK) 4 MG TBPK tablet Follow package insert on back. 01/20/20   Hyman Hopes, Sherrick Araki, PA-C  naproxen (NAPROSYN) 500 MG tablet Take 1 tablet (500 mg total) by mouth 2 (two) times daily. 11/16/19   Vanetta Mulders, MD  penicillin v potassium (VEETID) 500 MG tablet Take 1 tablet (500 mg total) by mouth 4 (four) times daily. 06/07/15   Ward, Layla Maw, DO  polyethylene glycol (MIRALAX) packet Take 17 g by mouth daily. 09/01/18   Maxwell Caul, PA-C  predniSONE (DELTASONE) 20 MG tablet 3 tabs po day one, then 2 po daily x 4 days 02/05/18   Loren Racer, MD  traMADol (ULTRAM) 50 MG tablet Take 1 tablet (50 mg total) by mouth  every 6 (six) hours as needed. 06/07/15   Ward, Delice Bison, DO    Allergies    Latex  Review of Systems   Review of Systems  Constitutional: Negative for chills and fever.  Genitourinary: Negative for difficulty urinating.  Musculoskeletal: Positive for back pain.  Neurological: Negative for weakness and numbness.       + paresthesias to RLE    Physical Exam Updated Vital Signs BP (!) 158/102 (BP Location: Left Arm)   Pulse 86   Temp 98.5 F (36.9 C) (Oral)   Resp 18   Ht 5\' 8"  (1.727 m)   Wt 113.4 kg   SpO2 99%   BMI 38.01 kg/m   Physical Exam Vitals and nursing note reviewed.  Constitutional:      Appearance: She is not ill-appearing or diaphoretic.  HENT:      Head: Normocephalic and atraumatic.  Eyes:     Conjunctiva/sclera: Conjunctivae normal.  Cardiovascular:     Rate and Rhythm: Normal rate and regular rhythm.     Pulses: Normal pulses.  Pulmonary:     Effort: Pulmonary effort is normal.     Breath sounds: Normal breath sounds. No wheezing, rhonchi or rales.  Musculoskeletal:     Comments: No C or T midline spinal tenderness. + lumbar midline tenderness and paraspinal tenderness bilaterally, R > L side. Strength and sensation intact to BLEs. + SLR on R. Negative SLR on L. 2+ DP pulses.   Skin:    General: Skin is warm and dry.     Coloration: Skin is not jaundiced.  Neurological:     Mental Status: She is alert.     ED Results / Procedures / Treatments   Labs (all labs ordered are listed, but only abnormal results are displayed) Labs Reviewed - No data to display  EKG None  Radiology DG Lumbar Spine Complete  Result Date: 01/20/2020 CLINICAL DATA:  Pt fell 2 days ago lifting weights and is having low back pain with left radiculopathy EXAM: LUMBAR SPINE - COMPLETE 4+ VIEW COMPARISON:  None. FINDINGS: On the focused lateral view of the lower lumbar spine, there is a subtle cortical step-off along the posterior margin of the L5 vertebra consistent with a fracture. This appears to extend obliquely to the inferior endplate. No significant loss of vertebral body height. There is no other evidence of a fracture. Generalized straightening of the normal lumbar lordosis. No spondylolisthesis. Discs are well maintained in height. Facet joints are well maintained. Soft tissues are unremarkable. IMPRESSION: 1. Subtle nondisplaced fracture along the lower aspect of the L5 vertebral body. 2. No other fractures. 3. Straightening of the normal lumbar lordosis. No other abnormalities. Electronically Signed   By: Lajean Manes M.D.   On: 01/20/2020 13:14   CT Lumbar Spine Wo Contrast  Result Date: 01/20/2020 CLINICAL DATA:  Low back pain after working  out on Wednesday. Radiates to the abdomen and upper thighs EXAM: CT LUMBAR SPINE WITHOUT CONTRAST TECHNIQUE: Multidetector CT imaging of the lumbar spine was performed without intravenous contrast administration. Multiplanar CT image reconstructions were also generated. COMPARISON:  None. FINDINGS: Segmentation: 5 lumbar type vertebrae. Alignment: Normal. Vertebrae: No acute fracture or focal pathologic process. Mild osteoarthritis of bilateral SI joints. Paraspinal and other soft tissues: Negative. Disc levels: Disc spaces are maintained.  No foraminal stenosis. IMPRESSION: 1. No acute osseous injury of the lumbar spine. Electronically Signed   By: Kathreen Devoid   On: 01/20/2020 14:47   CT PELVIS WO  CONTRAST  Result Date: 01/20/2020 CLINICAL DATA:  Acute lower back pain after working out. EXAM: CT PELVIS WITHOUT CONTRAST TECHNIQUE: Multidetector CT imaging of the pelvis was performed following the standard protocol without intravenous contrast. COMPARISON:  September 29, 2006. FINDINGS: Urinary Tract:  No abnormality visualized. Bowel:  Unremarkable visualized pelvic bowel loops. Vascular/Lymphatic: No pathologically enlarged lymph nodes. No significant vascular abnormality seen. Reproductive:  No mass or other significant abnormality Other:  None. Musculoskeletal: No suspicious bone lesions identified. IMPRESSION: No definite abnormality seen in the pelvis. Electronically Signed   By: Lupita Raider M.D.   On: 01/20/2020 14:49    Procedures Procedures (including critical care time)  Medications Ordered in ED Medications  HYDROcodone-acetaminophen (NORCO/VICODIN) 5-325 MG per tablet 1 tablet (1 tablet Oral Given 01/20/20 1350)    ED Course  I have reviewed the triage vital signs and the nursing notes.  Pertinent labs & imaging results that were available during my care of the patient were reviewed by me and considered in my medical decision making (see chart for details).  34 year old female  presents the ED complaining of sudden onset of lower back pain that began Wednesday after working out when her personal trainer added a 45 pound weight to the barbell causing patient to fall landing directly onto her knees and causing the barbell to fall onto her back.  Patient has midline spinal tenderness to the lumbar area with lateral paraspinal muscle tenderness, right greater than left.  She is describing sciatic type symptoms radiating down right lower extremity however strength and sensation intact throughout.  No red flag symptoms today concerning for cauda equina, spinal epidural abscess, bullae.  On arrival to the ED patient is afebrile, nontachycardic and nontachypneic.  Given midline pain and injury will obtain x-ray at this time.  Xray with findings of L5 vertebral fracture. Given this and sciatic type pain will call neurosurgery for further evaluation. Question whether patient needs emergent MRI vs outpatient follow up with TSLO brace.   Clinical Course as of Jan 19 1518  Fri Jan 20, 2020  1406 Discussed case with Dr. Adelene Idler with neurosurg who recommends CT scan to further evaluate fracture.    [MV]  1505 No signs of fracture - spoke directly with Dr. Allena Katz with radiology who believes it was a misread on Xray; no signs of osseous abnormalities on CT scan  CT Lumbar Spine Wo Contrast [MV]    Clinical Course User Index [MV] Tanda Rockers, PA-C   Given no signs of fracture on CT scans will treat for sciatica at this time. Pt advised to follow up with her PCP regarding her ED visit today. Strict return precautions discussed. Pt is in agreement with plan at this time and stable for discharge home.   This note was prepared using Dragon voice recognition software and may include unintentional dictation errors due to the inherent limitations of voice recognition software.  MDM Rules/Calculators/A&P                       Final Clinical Impression(s) / ED Diagnoses Final diagnoses:    Acute midline low back pain with right-sided sciatica    Rx / DC Orders ED Discharge Orders         Ordered    methylPREDNISolone (MEDROL DOSEPAK) 4 MG TBPK tablet     01/20/20 1515    lidocaine (LIDODERM) 5 %  Every 24 hours     01/20/20 1515  Discharge Instructions     Your CT scan did not show any signs concerning for a fracture today. Your symptoms sound consistent with sciatica. Please take medication as prescribed. DO NOT take any nonsteroidal antiinflammatories with this as it could cause an increase for gastric bleeding. You may take Tylenol 1,000 mg every 6-8 hours.  Please use lidocaine patches as well for comfort.  Follow up with your PCP regarding your ED visit today. Return to the ED for any worsening symptoms including worsening back pain, inability to ambulate, inability to feel your groin area, holding onto urine, accidentally peeing or pooping on yourself.         Tanda Rockers, PA-C 01/20/20 1519    Arby Barrette, MD 01/23/20 [phone]

## 2020-01-20 NOTE — ED Triage Notes (Signed)
Reports low back pain since working out Wednesday.  Radiates into lower abdomen and into thighs at times.  Reports using heat and blue emu cream at home with no relief.   Also took ibuprofen and tylenol with no relief.

## 2020-01-20 NOTE — ED Provider Notes (Signed)
[facility] EMERGENCY DEPARTMENT Provider Note   CSN: [id number] Arrival date & time: 11/16/19  1949     History Chief Complaint  Patient presents with  . Leg Problem    Adrienne Torres is a 34 y.o. female.  This is a duplicate note.  Previous note existed but it was missing physical exam.  Patient with a known right fifth metatarsal injury.  But also was presenting for concerned about right ankle concern.  Patient had a known fall 5 weeks ago and had known to have a nondisplaced fracture of the base of the right fifth metatarsal.  Continued pain.        Past Medical History:  Diagnosis Date  . Endometriosis     Patient Active Problem List   Diagnosis Date Noted  . Closed fracture of base of fifth metatarsal bone 11/29/2019  . Hematoma 11/29/2019  . Acute right ankle pain 11/29/2019    History reviewed. No pertinent surgical history.   OB History   No obstetric history on file.     No family history on file.  Social History   Tobacco Use  . Smoking status: Former Research scientist (life sciences)  . Smokeless tobacco: Never Used  Substance Use Topics  . Alcohol use: No  . Drug use: No    Home Medications Prior to Admission medications   Medication Sig Start Date End Date Taking? Authorizing Provider  benzocaine (ORAJEL) 10 % mucosal gel Use as directed 1 application in the mouth or throat as needed for mouth pain.    [provider]  benzonatate (TESSALON) 100 MG capsule Take 1 capsule (100 mg total) by mouth 3 (three) times daily as needed for cough. 02/05/18   Julianne Rice, MD  ibuprofen (ADVIL,MOTRIN) 800 MG tablet Take 1 tablet (800 mg total) by mouth every 8 (eight) hours as needed for mild pain. 06/07/15   Ward, Delice Bison, DO  loratadine (CLARITIN) 10 MG tablet Take 1 tablet (10 mg total) by mouth daily. 02/05/18   Julianne Rice, MD  naproxen (NAPROSYN) 500 MG tablet Take 1 tablet (500 mg total) by mouth 2 (two) times daily. 11/16/19   Fredia Sorrow,  MD  penicillin v potassium (VEETID) 500 MG tablet Take 1 tablet (500 mg total) by mouth 4 (four) times daily. 06/07/15   Ward, Delice Bison, DO  polyethylene glycol (MIRALAX) packet Take 17 g by mouth daily. 09/01/18   Volanda Napoleon, PA-C  predniSONE (DELTASONE) 20 MG tablet 3 tabs po day one, then 2 po daily x 4 days 02/05/18   Julianne Rice, MD  traMADol (ULTRAM) 50 MG tablet Take 1 tablet (50 mg total) by mouth every 6 (six) hours as needed. 06/07/15   Ward, Delice Bison, DO    Allergies    Latex  Review of Systems   Review of Systems  Constitutional: Negative for chills and fever.  HENT: Negative for rhinorrhea and sore throat.   Eyes: Negative for visual disturbance.  Respiratory: Negative for cough and shortness of breath.   Cardiovascular: Negative for chest pain and leg swelling.  Gastrointestinal: Negative for abdominal pain, diarrhea, nausea and vomiting.  Genitourinary: Negative for dysuria.  Musculoskeletal: Positive for joint swelling. Negative for back pain and neck pain.  Skin: Negative for rash.  Neurological: Negative for dizziness, light-headedness and headaches.  Hematological: Does not bruise/bleed easily.  Psychiatric/Behavioral: Negative for confusion.    Physical Exam Updated Vital Signs BP (!) 118/92   Pulse 84   Temp 98.1 F (36.7  C) (Oral)   Resp 17   Ht 1.727 m (5\' 8" )   Wt 115.2 kg   LMP 11/15/2019   SpO2 100%   BMI 38.62 kg/m   Physical Exam Vitals and nursing note reviewed.  Constitutional:      General: She is not in acute distress.    Appearance: Normal appearance. She is well-developed.  HENT:     Head: Normocephalic and atraumatic.  Eyes:     Extraocular Movements: Extraocular movements intact.     Conjunctiva/sclera: Conjunctivae normal.     Pupils: Pupils are equal, round, and reactive to light.  Cardiovascular:     Rate and Rhythm: Normal rate and regular rhythm.     Heart sounds: No murmur.  Pulmonary:     Effort: Pulmonary effort  is normal. No respiratory distress.     Breath sounds: Normal breath sounds.  Abdominal:     Palpations: Abdomen is soft.     Tenderness: There is no abdominal tenderness.  Musculoskeletal:        General: Swelling present. Normal range of motion.     Cervical back: Normal range of motion and neck supple.     Comments: Right foot ankle area with some discoloration and swelling.  Tenderness predominantly along the lateral aspect of the foot.  Skin:    General: Skin is warm and dry.     Capillary Refill: Capillary refill takes less than 2 seconds.  Neurological:     General: No focal deficit present.     Mental Status: She is alert.     ED Results / Procedures / Treatments   Labs (all labs ordered are listed, but only abnormal results are displayed) Labs Reviewed - No data to display  EKG None  Radiology DG Lumbar Spine Complete  Result Date: 01/20/2020 CLINICAL DATA:  Pt fell 2 days ago lifting weights and is having low back pain with left radiculopathy EXAM: LUMBAR SPINE - COMPLETE 4+ VIEW COMPARISON:  None. FINDINGS: On the focused lateral view of the lower lumbar spine, there is a subtle cortical step-off along the posterior margin of the L5 vertebra consistent with a fracture. This appears to extend obliquely to the inferior endplate. No significant loss of vertebral body height. There is no other evidence of a fracture. Generalized straightening of the normal lumbar lordosis. No spondylolisthesis. Discs are well maintained in height. Facet joints are well maintained. Soft tissues are unremarkable. IMPRESSION: 1. Subtle nondisplaced fracture along the lower aspect of the L5 vertebral body. 2. No other fractures. 3. Straightening of the normal lumbar lordosis. No other abnormalities. Electronically Signed   By: 01/22/2020 M.D.   On: 01/20/2020 13:14   CT Lumbar Spine Wo Contrast  Result Date: 01/20/2020 CLINICAL DATA:  Low back pain after working out on Wednesday. Radiates to  the abdomen and upper thighs EXAM: CT LUMBAR SPINE WITHOUT CONTRAST TECHNIQUE: Multidetector CT imaging of the lumbar spine was performed without intravenous contrast administration. Multiplanar CT image reconstructions were also generated. COMPARISON:  None. FINDINGS: Segmentation: 5 lumbar type vertebrae. Alignment: Normal. Vertebrae: No acute fracture or focal pathologic process. Mild osteoarthritis of bilateral SI joints. Paraspinal and other soft tissues: Negative. Disc levels: Disc spaces are maintained.  No foraminal stenosis. IMPRESSION: 1. No acute osseous injury of the lumbar spine. Electronically Signed   By: Tuesday   On: 01/20/2020 14:47   CT PELVIS WO CONTRAST  Result Date: 01/20/2020 CLINICAL DATA:  Acute lower back pain after working out.  EXAM: CT PELVIS WITHOUT CONTRAST TECHNIQUE: Multidetector CT imaging of the pelvis was performed following the standard protocol without intravenous contrast. COMPARISON:  September 29, 2006. FINDINGS: Urinary Tract:  No abnormality visualized. Bowel:  Unremarkable visualized pelvic bowel loops. Vascular/Lymphatic: No pathologically enlarged lymph nodes. No significant vascular abnormality seen. Reproductive:  No mass or other significant abnormality Other:  None. Musculoskeletal: No suspicious bone lesions identified. IMPRESSION: No definite abnormality seen in the pelvis. Electronically Signed   By: Lupita Raider M.D.   On: 01/20/2020 14:49    Procedures Procedures (including critical care time)  Medications Ordered in ED Medications - No data to display  ED Course  I have reviewed the triage vital signs and the nursing notes.  Pertinent labs & imaging results that were available during my care of the patient were reviewed by me and considered in my medical decision making (see chart for details).    MDM Rules/Calculators/A&P                      Based on x-rays no evidence of any new bony injury.  Could have ankle sprain on top of this.   Also there is a fair amount of swelling.  Patient come back and get Doppler studies in the morning.  And also will need follow-up for the fifth metatarsal.   Final Clinical Impression(s) / ED Diagnoses Final diagnoses:  Sprain of right ankle, unspecified ligament, initial encounter  Right leg swelling  Closed displaced fracture of fifth metatarsal bone of right foot with delayed healing, subsequent encounter    Rx / DC Orders ED Discharge Orders         Ordered    naproxen (NAPROSYN) 500 MG tablet  2 times daily     11/16/19 2321           Vanetta Mulders, MD 01/20/20 1456

## 2020-01-20 NOTE — Discharge Instructions (Addendum)
Your CT scan did not show any signs concerning for a fracture today. Your symptoms sound consistent with sciatica. Please take medication as prescribed. DO NOT take any nonsteroidal antiinflammatories with this as it could cause an increase for gastric bleeding. You may take Tylenol 1,000 mg every 6-8 hours.  Please use lidocaine patches as well for comfort.  Follow up with your PCP regarding your ED visit today. Return to the ED for any worsening symptoms including worsening back pain, inability to ambulate, inability to feel your groin area, holding onto urine, accidentally peeing or pooping on yourself.

## 2020-01-20 NOTE — ED Notes (Signed)
Taken to xray at this time. 

## 2021-01-26 IMAGING — DX DG FOOT COMPLETE 3+V*R*
3 series · 3 of 3 positions shown · non-contrast
Comparison: Right foot series 10/04/2019.

CLINICAL DATA: 33-year-old female status post fall 5 weeks ago with
nondisplaced fracture of the base of the right 5th metatarsal.
Continued pain.

EXAM:
RIGHT FOOT COMPLETE - 3+ VIEW

[foot ap]
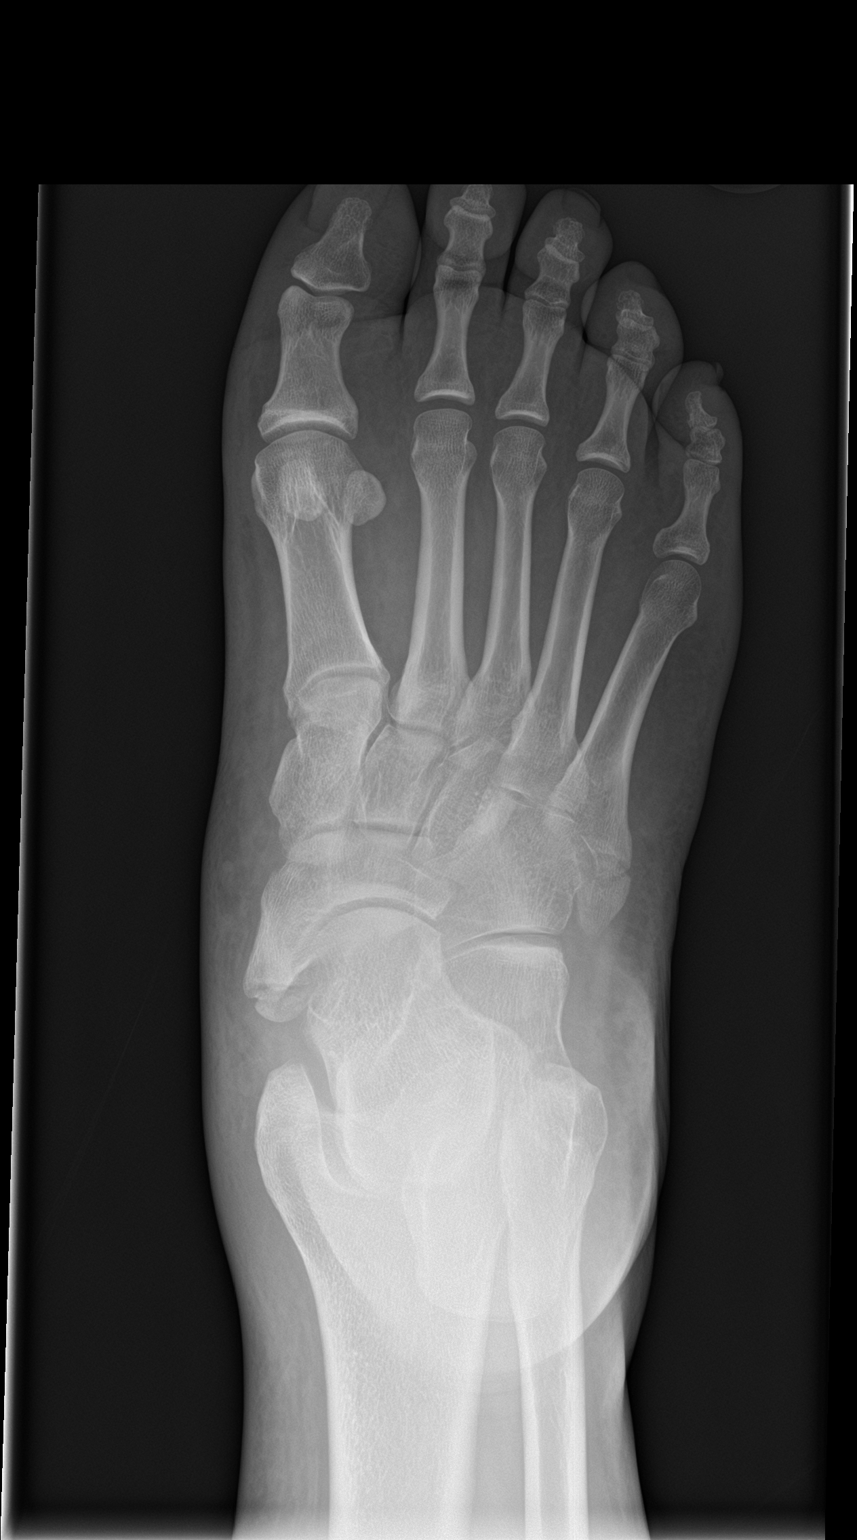

[foot obl]
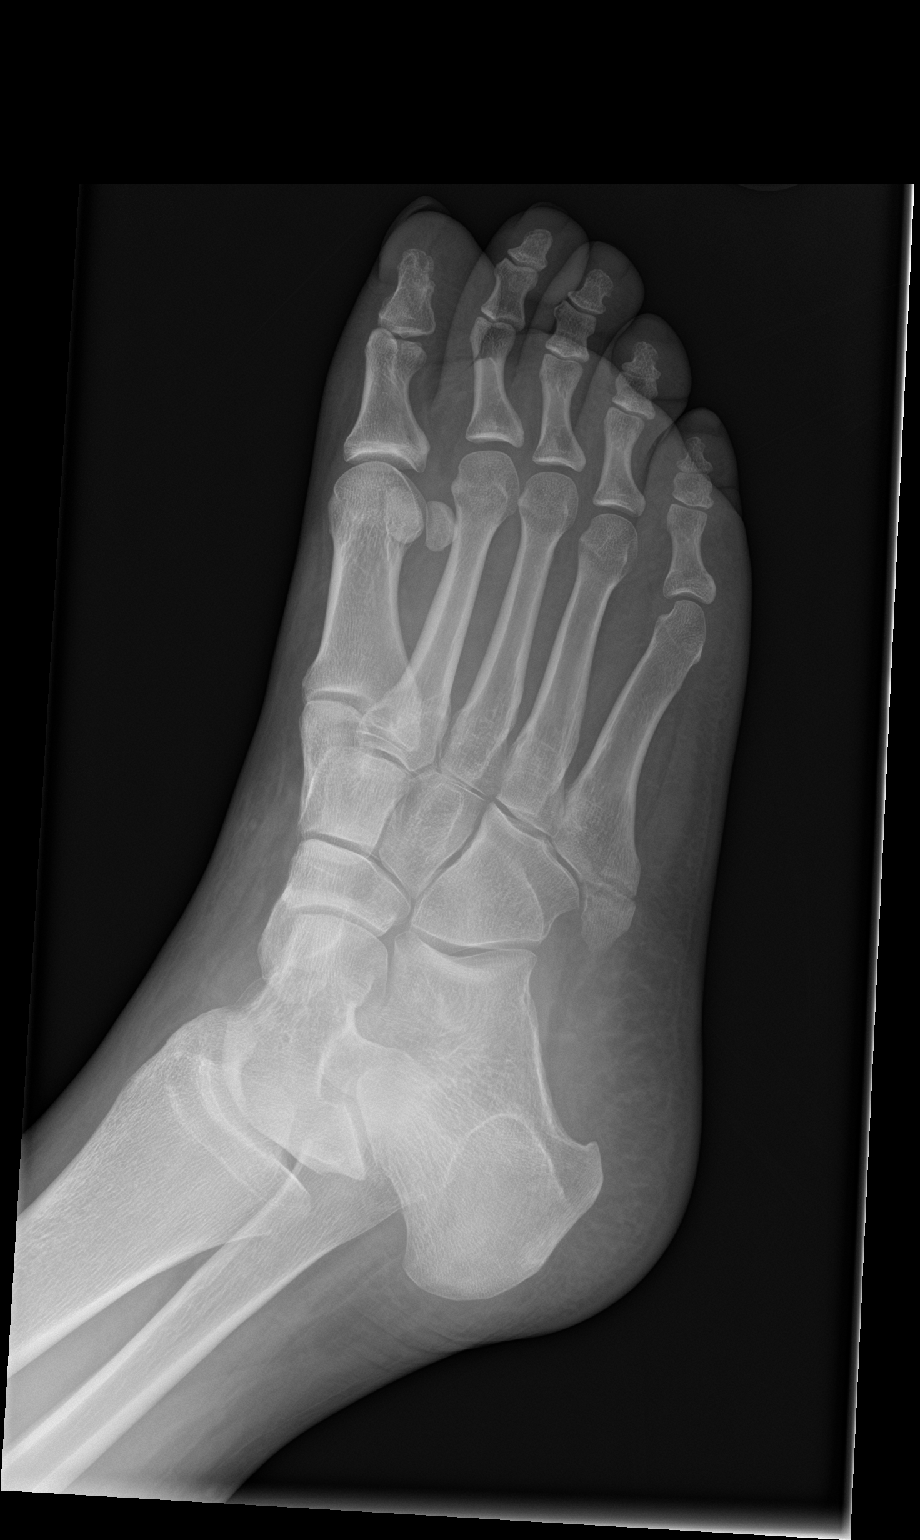

[foot lat]
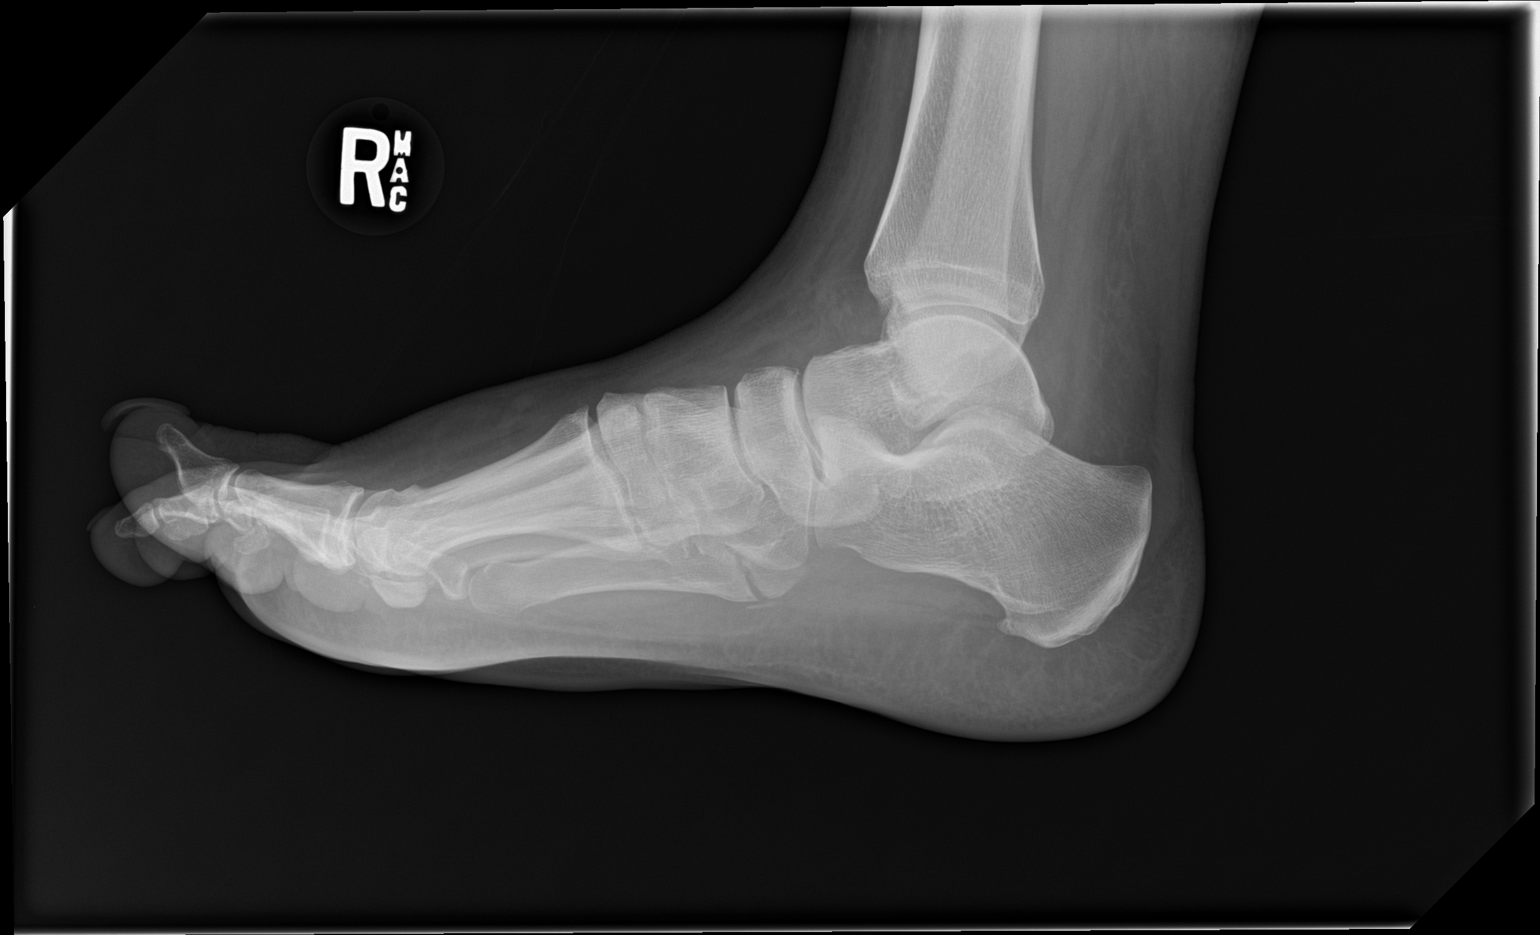

[3 of 3 positions shown; findings below may reference images not displayed]

FINDINGS: Increased soft tissue swelling about the right foot. Joint spaces
are maintained. Un healed mildly comminuted but still minimally
displaced fracture at the base of the 5th metatarsal. Increased
fracture lucency since [REDACTED]. Small plantar linear periosteal new
bone formation or less likely comminution fragment.

Other osseous structures appear stable and intact. Accessory ossicle
along the posterior navicular.
IMPRESSION: 1. Unhealed but still minimally displaced fracture at the base of
the right 5th metatarsal.
2. Increased soft tissue swelling since [DATE]. No new osseous abnormality.

## 2021-01-26 IMAGING — DX DG TIBIA/FIBULA 2V*R*
4 series · 4 of 4 positions shown · non-contrast
Comparison: Right ankle series 10/04/2019.

CLINICAL DATA: 33-year-old female status post fall 5 weeks ago with
nondisplaced fracture of the base of the right 5th metatarsal.
Continued pain.

EXAM:
RIGHT TIBIA AND FIBULA - 2 VIEW

[tibia ap (1 of 2)]
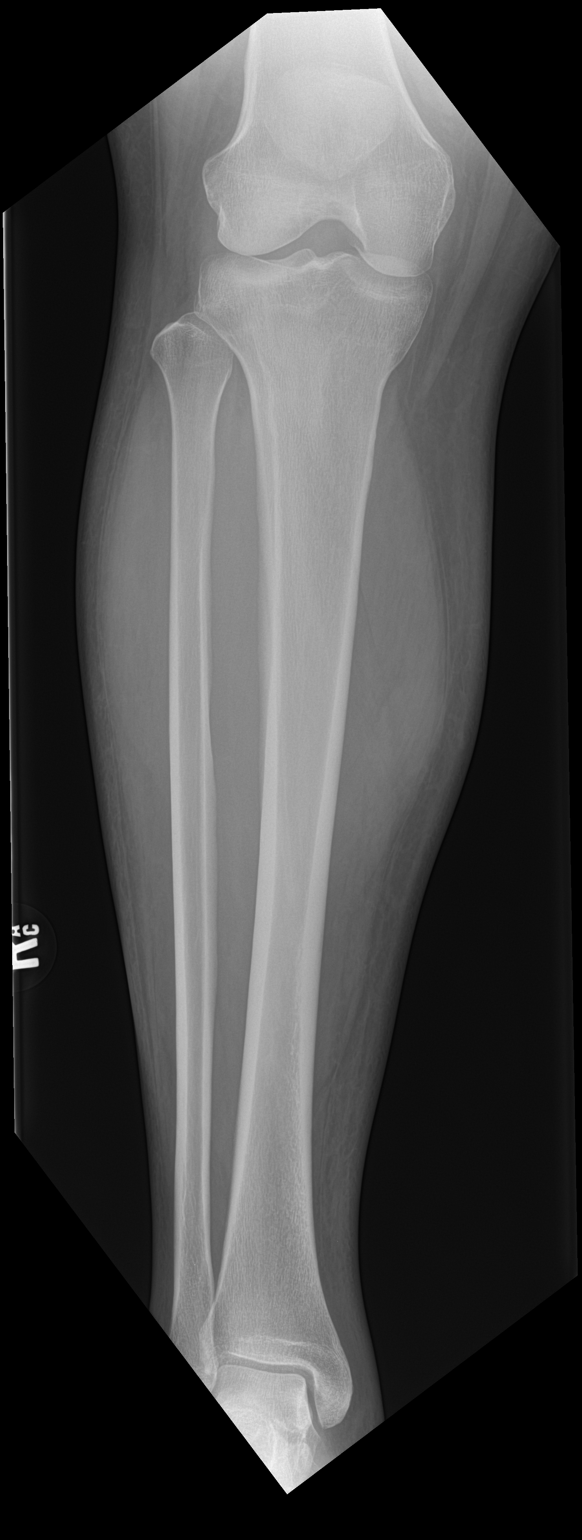

[tibia ap (2 of 2)]
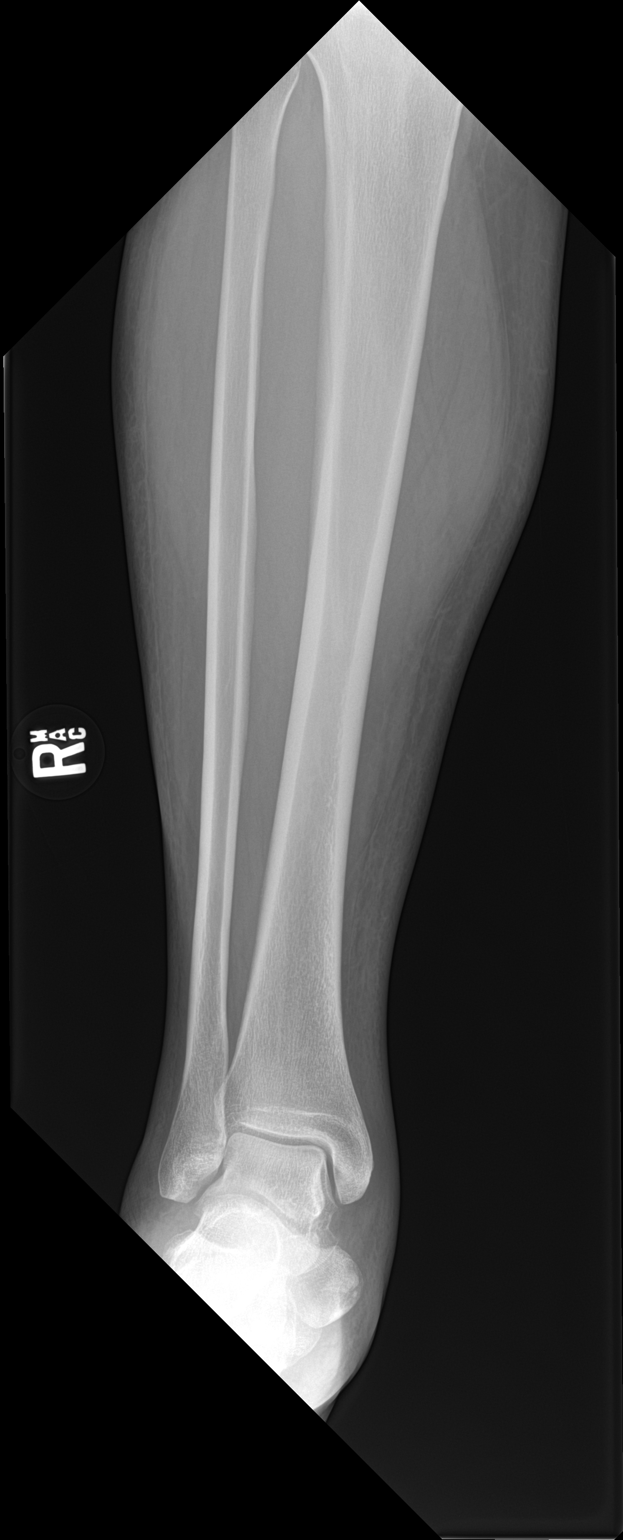

[tibia lat (1 of 2)]
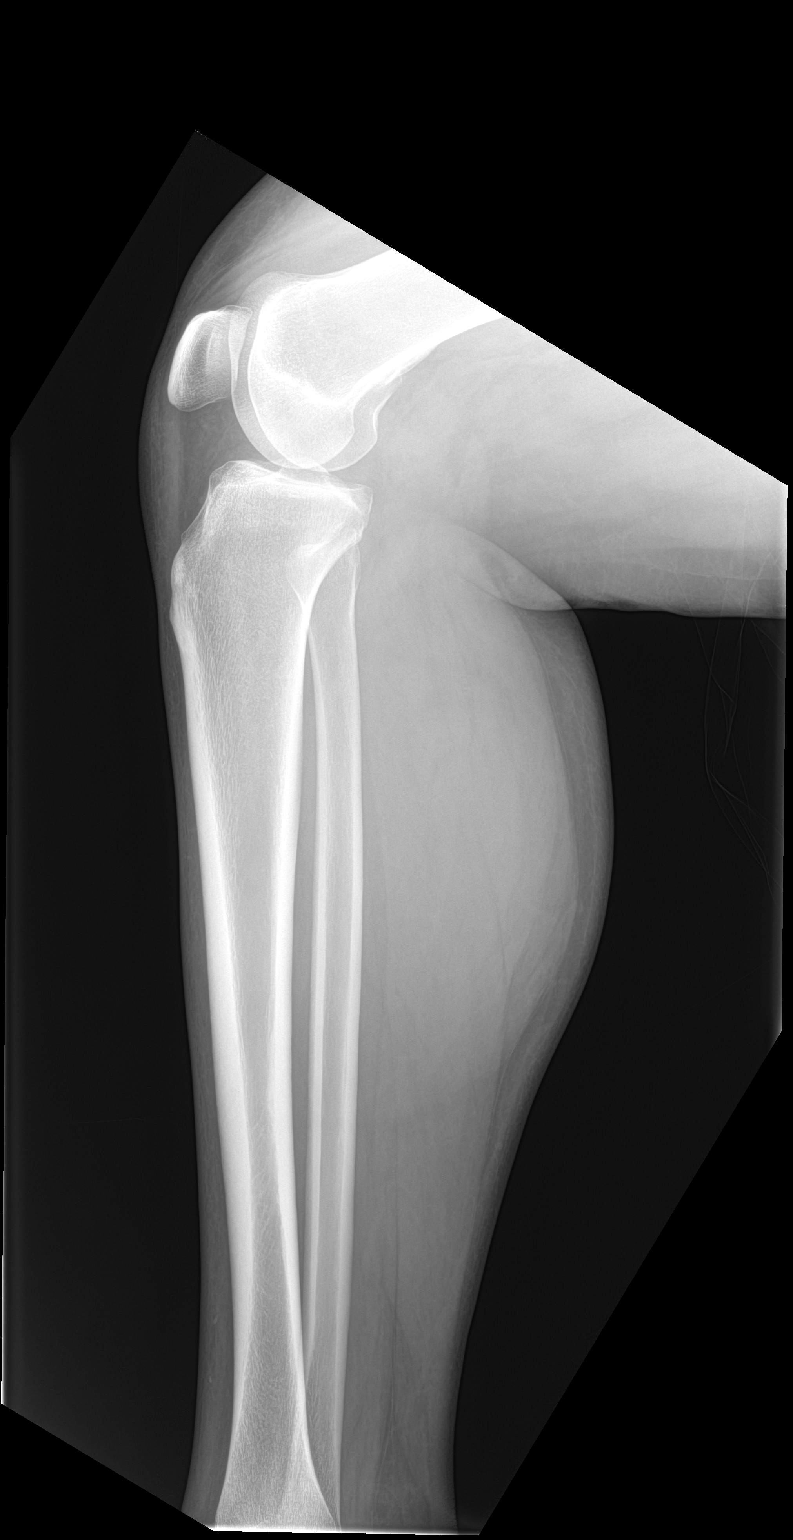

[tibia lat (2 of 2)]
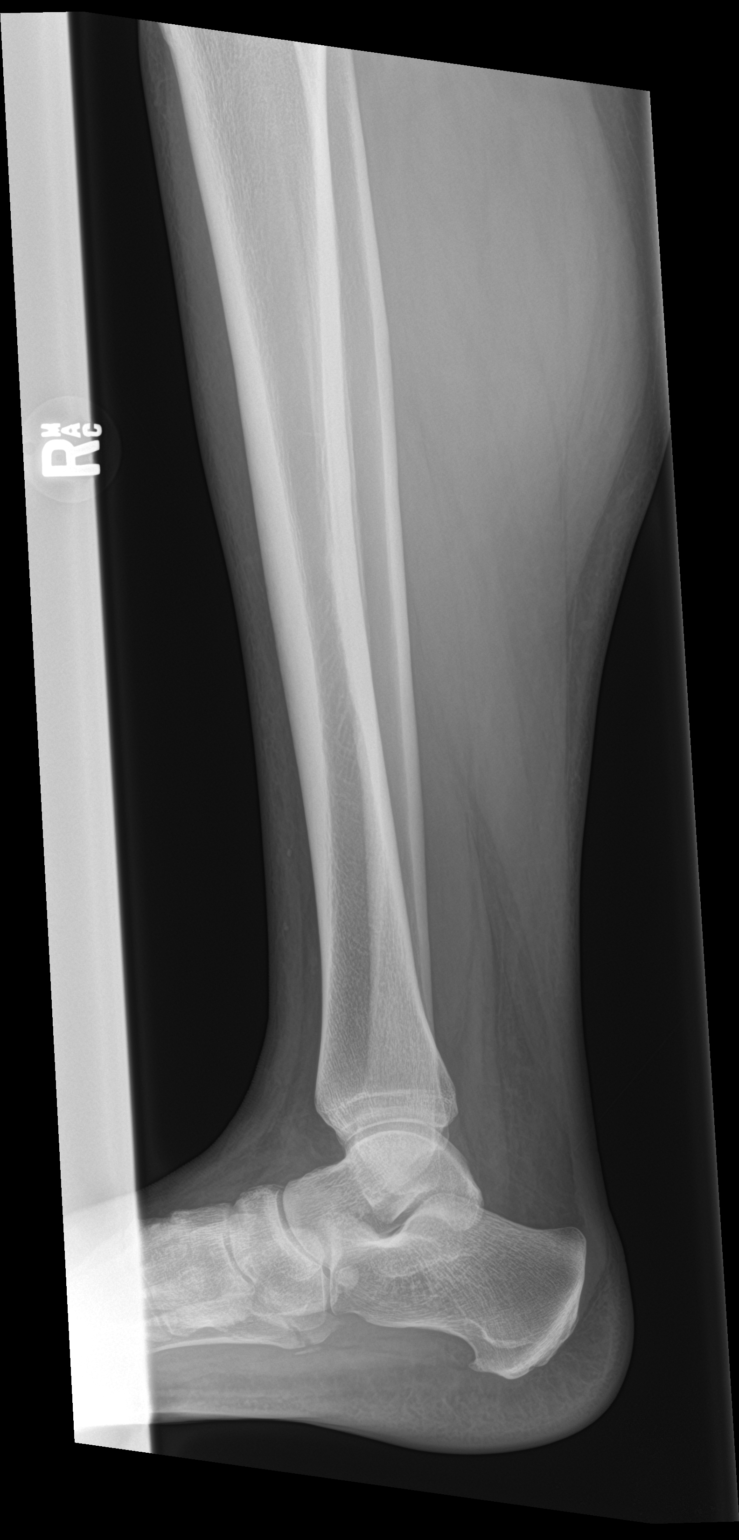

[4 of 4 positions shown; findings below may reference images not displayed]

FINDINGS: Bone mineralization is within normal limits. Abnormal 5th
metatarsal, reported separately today. Intact right tibia and fibula
with no other evidence of fracture or other focal bone lesions. Mild
soft tissue swelling, including along the medial distal leg.
IMPRESSION: 1.  No acute osseous abnormality identified in the right tib-fib.
2. Abnormal right 5th metatarsal reported separately.
3. Soft tissue swelling in the distal lower extremity.

## 2022-01-06 ENCOUNTER — Encounter (HOSPITAL_COMMUNITY): Payer: Self-pay | Admitting: Emergency Medicine

## 2022-01-06 ENCOUNTER — Emergency Department (HOSPITAL_COMMUNITY)
Admission: EM | Admit: 2022-01-06 | Discharge: 2022-01-06 | Disposition: A | Discharge status: Home / Self Care | Payer: No Typology Code available for payment source | Attending: Emergency Medicine | Admitting: Emergency Medicine

## 2022-01-06 ENCOUNTER — Emergency Department (HOSPITAL_COMMUNITY): Payer: No Typology Code available for payment source

## 2022-01-06 DIAGNOSIS — Z91040 Latex allergy status: Secondary | ICD-10-CM | POA: Insufficient documentation

## 2022-01-06 DIAGNOSIS — Y92410 Unspecified street and highway as the place of occurrence of the external cause: Secondary | ICD-10-CM | POA: Insufficient documentation

## 2022-01-06 DIAGNOSIS — S199XXA Unspecified injury of neck, initial encounter: Secondary | ICD-10-CM | POA: Diagnosis present

## 2022-01-06 DIAGNOSIS — S0990XA Unspecified injury of head, initial encounter: Secondary | ICD-10-CM | POA: Insufficient documentation

## 2022-01-06 DIAGNOSIS — S0081XA Abrasion of other part of head, initial encounter: Secondary | ICD-10-CM

## 2022-01-06 DIAGNOSIS — S161XXA Strain of muscle, fascia and tendon at neck level, initial encounter: Secondary | ICD-10-CM

## 2022-01-06 DIAGNOSIS — S20212A Contusion of left front wall of thorax, initial encounter: Secondary | ICD-10-CM

## 2022-01-06 DIAGNOSIS — S8002XA Contusion of left knee, initial encounter: Secondary | ICD-10-CM | POA: Diagnosis not present

## 2022-01-06 LAB — PREGNANCY, URINE: Preg Test, Ur: NEGATIVE

## 2022-01-06 MED ORDER — HYDROCODONE-ACETAMINOPHEN 5-325 MG PO TABS
1.0000 | ORAL_TABLET | Freq: Once | ORAL | Status: AC
  Administered 2022-01-06: 1 via ORAL
  Filled 2022-01-06: qty 1

## 2022-01-06 MED ORDER — HYDROCODONE-ACETAMINOPHEN 5-325 MG PO TABS: 1.0000 | ORAL_TABLET | Freq: Four times a day (QID) | ORAL | 0 refills | Status: AC | PRN

## 2022-01-06 MED ORDER — IBUPROFEN 800 MG PO TABS
800.0000 mg | ORAL_TABLET | Freq: Once | ORAL | Status: AC
  Administered 2022-01-06: 800 mg via ORAL
  Filled 2022-01-06: qty 1

## 2022-01-06 MED ORDER — ACETAMINOPHEN 500 MG PO TABS
1000.0000 mg | ORAL_TABLET | Freq: Once | ORAL | Status: AC
  Administered 2022-01-06: 1000 mg via ORAL
  Filled 2022-01-06: qty 2

## 2022-01-06 MED ORDER — METHOCARBAMOL 750 MG PO TABS: 750.0000 mg | ORAL_TABLET | Freq: Four times a day (QID) | ORAL | 0 refills | Status: AC

## 2022-01-06 MED ORDER — IBUPROFEN 800 MG PO TABS: 800.0000 mg | ORAL_TABLET | Freq: Three times a day (TID) | ORAL | 0 refills | Status: AC

## 2022-01-06 NOTE — ED Notes (Signed)
Dressing applied. 

## 2022-01-06 NOTE — ED Notes (Signed)
Patient called x 2 with no response. 
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
## 2022-01-06 NOTE — Discharge Instructions (Signed)
1.  Take ibuprofen 800 mg every 8 hours with food for your baseline pain control.  You may also have a lot of muscle stiffness and spasm.  May take Robaxin for muscle spasms as prescribed.  If additional pain control as needed you may take 1-2 Vicodin tablets in addition to your ibuprofen.  Take well wrapped ice packs for areas that are sore and swollen.  Apply for about 20 minutes every 2 hours. Return to the emergency department immediately if you get difficulty breathing, weakness numbness or tingling to your arms or legs or other concerning symptoms. Make an appointment to see your doctor for recheck within the next 2 to 4 days.
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
## 2022-03-23 ENCOUNTER — Other Ambulatory Visit: Payer: Self-pay

## 2022-03-23 ENCOUNTER — Emergency Department (HOSPITAL_BASED_OUTPATIENT_CLINIC_OR_DEPARTMENT_OTHER): Payer: Self-pay

## 2022-03-23 ENCOUNTER — Encounter (HOSPITAL_BASED_OUTPATIENT_CLINIC_OR_DEPARTMENT_OTHER): Payer: Self-pay | Admitting: *Deleted

## 2022-03-23 ENCOUNTER — Emergency Department (HOSPITAL_BASED_OUTPATIENT_CLINIC_OR_DEPARTMENT_OTHER)
Admission: EM | Admit: 2022-03-23 | Discharge: 2022-03-24 | Disposition: A | Discharge status: Home / Self Care | Payer: Self-pay | Attending: Emergency Medicine | Admitting: Emergency Medicine

## 2022-03-23 DIAGNOSIS — I10 Essential (primary) hypertension: Secondary | ICD-10-CM | POA: Insufficient documentation

## 2022-03-23 DIAGNOSIS — Z91040 Latex allergy status: Secondary | ICD-10-CM | POA: Insufficient documentation

## 2022-03-23 DIAGNOSIS — R079 Chest pain, unspecified: Secondary | ICD-10-CM

## 2022-03-23 LAB — CBC
HCT: 38.2 % (ref 36.0–46.0)
Hemoglobin: 13.4 g/dL (ref 12.0–15.0)
MCH: 30.8 pg (ref 26.0–34.0)
MCHC: 35.1 g/dL (ref 30.0–36.0)
MCV: 87.8 fL (ref 80.0–100.0)
Platelets: 269 10*3/uL (ref 150–400)
RBC: 4.35 MIL/uL (ref 3.87–5.11)
RDW: 12.4 % (ref 11.5–15.5)
WBC: 9.4 10*3/uL (ref 4.0–10.5)
nRBC: 0 % (ref 0.0–0.2)

## 2022-03-23 NOTE — ED Triage Notes (Addendum)
Pt reports recurrent chest pain over the last week, at times accompanied by dizziness, nausea, SOB, clamminess, headache, and weakness. States left arm "tingly" x 3 days ?

## 2022-03-24 LAB — BASIC METABOLIC PANEL
Anion gap: 7 (ref 5–15)
BUN: 12 mg/dL (ref 6–20)
CO2: 23 mmol/L (ref 22–32)
Calcium: 8.9 mg/dL (ref 8.9–10.3)
Chloride: 108 mmol/L (ref 98–111)
Creatinine, Ser: 0.88 mg/dL (ref 0.44–1.00)
GFR, Estimated: >60 mL/min (ref >60)
Glucose, Bld: 101 mg/dL — ABNORMAL HIGH (ref 70–99)
Potassium: 3.9 mmol/L (ref 3.5–5.1)
Sodium: 138 mmol/L (ref 135–145)

## 2022-03-24 LAB — TROPONIN I (HIGH SENSITIVITY)
Troponin I (High Sensitivity): 3 ng/L (ref <18)
Troponin I (High Sensitivity): 3 ng/L (ref <18)
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
## 2022-03-25 ENCOUNTER — Telehealth: Payer: Self-pay

## 2022-03-25 NOTE — Telephone Encounter (Signed)
RNCM received TOC consult for PCP, post patient discharge. Left voicemail message for a call back. ?

## 2022-07-20 ENCOUNTER — Emergency Department (HOSPITAL_BASED_OUTPATIENT_CLINIC_OR_DEPARTMENT_OTHER): Payer: Self-pay | Admitting: Radiology

## 2022-07-20 ENCOUNTER — Emergency Department (HOSPITAL_BASED_OUTPATIENT_CLINIC_OR_DEPARTMENT_OTHER)
Admission: EM | Admit: 2022-07-20 | Discharge: 2022-07-20 | Disposition: A | Discharge status: Home / Self Care | Payer: Self-pay | Attending: Emergency Medicine | Admitting: Emergency Medicine

## 2022-07-20 ENCOUNTER — Encounter (HOSPITAL_BASED_OUTPATIENT_CLINIC_OR_DEPARTMENT_OTHER): Payer: Self-pay | Admitting: Obstetrics and Gynecology

## 2022-07-20 ENCOUNTER — Other Ambulatory Visit: Payer: Self-pay

## 2022-07-20 DIAGNOSIS — M25531 Pain in right wrist: Secondary | ICD-10-CM | POA: Insufficient documentation

## 2022-07-20 DIAGNOSIS — W1839XA Other fall on same level, initial encounter: Secondary | ICD-10-CM | POA: Insufficient documentation

## 2022-07-20 DIAGNOSIS — Z91040 Latex allergy status: Secondary | ICD-10-CM | POA: Insufficient documentation

## 2022-07-20 DIAGNOSIS — S60211A Contusion of right wrist, initial encounter: Secondary | ICD-10-CM | POA: Insufficient documentation

## 2022-07-20 NOTE — Discharge Instructions (Signed)
Wear splint for the next wek.  Follow up with Hand surgeon for evaluation

## 2022-07-20 NOTE — ED Triage Notes (Signed)
Patient reports to the ER for right hand pain. Patient reports she fell on it earlier in the week but she has continued to use it. Reports the pain has gotten worse and the swelling is intense. Patient reports she cannot use her phone with the thumb. Patient has a brace on from home.

## 2022-07-20 NOTE — ED Provider Notes (Signed)
[facility] EMERGENCY DEPT Provider Note   CSN: [id number] Arrival date & time: 07/20/22  1135     History  Chief Complaint  Patient presents with   Hand Injury    Adrienne Torres is a 36 y.o. female.  Pt reports she fell a week ago.  Pt complains of continued pain and swelling  The history is provided by the patient. No language interpreter was used.  Hand Injury Location:  Hand and wrist Wrist location:  R wrist Injury: no   Pain details:    Quality:  Aching   Radiates to:  Does not radiate   Severity:  Moderate   Onset quality:  Gradual   Duration:  2 days   Progression:  Worsening Handedness:  Right-handed Dislocation: no   Worsened by:  Nothing Ineffective treatments:  None tried Associated symptoms: decreased range of motion        Home Medications Prior to Admission medications   Medication Sig Start Date End Date Taking? Authorizing Provider  acetaminophen (TYLENOL) 500 MG tablet Take 500 mg by mouth every 6 (six) hours as needed for moderate pain or headache.    [provider]  HYDROcodone-acetaminophen (NORCO/VICODIN) 5-325 MG tablet Take 1-2 tablets by mouth every 6 (six) hours as needed for moderate pain or severe pain. 01/06/22   Arby Barrette, MD  ibuprofen (ADVIL) 800 MG tablet Take 1 tablet (800 mg total) by mouth 3 (three) times daily. 01/06/22   Arby Barrette, MD  ibuprofen (ADVIL,MOTRIN) 800 MG tablet Take 1 tablet (800 mg total) by mouth every 8 (eight) hours as needed for mild pain. Patient not taking: Reported on 01/06/2022 06/07/15   Ward, Layla Maw, DO  loratadine (CLARITIN) 10 MG tablet Take 1 tablet (10 mg total) by mouth daily. Patient not taking: Reported on 01/06/2022 02/05/18   Loren Racer, MD  methocarbamol (ROBAXIN-750) 750 MG tablet Take 1 tablet (750 mg total) by mouth 4 (four) times daily. 01/06/22   Arby Barrette, MD  naproxen (NAPROSYN) 500 MG tablet Take 1 tablet (500 mg total) by mouth 2 (two) times  daily. Patient not taking: Reported on 01/06/2022 11/16/19   Vanetta Mulders, MD  polyethylene glycol [facility]) packet Take 17 g by mouth daily. Patient not taking: Reported on 01/06/2022 09/01/18   Maxwell Caul, PA-C      Allergies    Latex    Review of Systems   Review of Systems  All other systems reviewed and are negative.   Physical Exam Updated Vital Signs BP (!) 158/97 (BP Location: Left Arm)   Pulse 83   Temp 98.1 F (36.7 C) (Oral)   Resp 14   Ht 5\' 8"  (1.727 m)   Wt 110.7 kg   LMP 07/14/2022 (Exact Date)   SpO2 95%   BMI 37.10 kg/m  Physical Exam Vitals and nursing note reviewed.  Constitutional:      Appearance: She is well-developed.  HENT:     Head: Normocephalic.  Pulmonary:     Effort: Pulmonary effort is normal.  Abdominal:     General: There is no distension.  Musculoskeletal:        General: Swelling and tenderness present. Normal range of motion.     Cervical back: Normal range of motion.     Comments: Tender thumb and thenar area and wrist   Neurological:     Mental Status: She is alert and oriented to person, place, and time.  Psychiatric:  Mood and Affect: Mood normal.     ED Results / Procedures / Treatments   Labs (all labs ordered are listed, but only abnormal results are displayed) Labs Reviewed - No data to display  EKG None  Radiology DG Hand Complete Right  Result Date: 07/20/2022 CLINICAL DATA:  Larey Seat while hiking 1 week ago. Radial pain. Right wrist and the base of thumb. EXAM: RIGHT HAND - COMPLETE 3+ VIEW COMPARISON:  None available FINDINGS: Normal bone mineralization. Joint spaces are preserved. No acute fracture is seen. No dislocation. IMPRESSION: Normal right hand radiographs. Electronically Signed   By: Neita Garnet M.D.   On: 07/20/2022 13:00   DG Wrist Complete Right  Result Date: 07/20/2022 CLINICAL DATA:  Fall. Wrist pain.Fell onto right hand 1 week ago while hiking. Radial sided pain. EXAM: RIGHT WRIST -  COMPLETE 3+ VIEW FINDINGS: Normal bone mineralization. Joint spaces are preserved. No acute fracture is seen. No dislocation. IMPRESSION: Normal right wrist radiographs. Electronically Signed   By: Neita Garnet M.D.   On: 07/20/2022 12:11    Procedures Procedures    Medications Ordered in ED Medications - No data to display  ED Course/ Medical Decision Making/ A&P                           Medical Decision Making Pt fel and injured her wrist  Amount and/or Complexity of Data Reviewed Radiology: ordered and independent interpretation performed. Decision-making details documented in ED Course.    Details: No fracture on wrist or hand xray  Risk Risk Details: Pt placed in a splint and advised to follow up with Orthopaedist in one week            Final Clinical Impression(s) / ED Diagnoses Final diagnoses:  Contusion of right wrist, initial encounter    Rx / DC Orders ED Discharge Orders     None      An After Visit Summary was printed and given to the patient.    Elson Areas, PA-C 07/20/22 1408    Ernie Avena, MD 07/20/22 [phone]

## 2023-04-13 ENCOUNTER — Encounter: Payer: Self-pay | Admitting: *Deleted

## 2024-06-09 ENCOUNTER — Telehealth (INDEPENDENT_AMBULATORY_CARE_PROVIDER_SITE_OTHER): Payer: Self-pay | Admitting: Primary Care

## 2024-06-09 NOTE — Telephone Encounter (Signed)
 Called pt to confirm appt. Pt did not answer and LVM

## 2024-06-10 ENCOUNTER — Ambulatory Visit (INDEPENDENT_AMBULATORY_CARE_PROVIDER_SITE_OTHER): Payer: Self-pay | Admitting: Primary Care

## 2024-10-21 ENCOUNTER — Emergency Department (HOSPITAL_BASED_OUTPATIENT_CLINIC_OR_DEPARTMENT_OTHER)
Admission: EM | Admit: 2024-10-21 | Discharge: 2024-10-21 | Disposition: A | Discharge status: Home / Self Care | Attending: Emergency Medicine | Admitting: Emergency Medicine

## 2024-10-21 ENCOUNTER — Emergency Department (HOSPITAL_BASED_OUTPATIENT_CLINIC_OR_DEPARTMENT_OTHER)

## 2024-10-21 ENCOUNTER — Other Ambulatory Visit: Payer: Self-pay

## 2024-10-21 DIAGNOSIS — I10 Essential (primary) hypertension: Secondary | ICD-10-CM | POA: Insufficient documentation

## 2024-10-21 DIAGNOSIS — G43809 Other migraine, not intractable, without status migrainosus: Secondary | ICD-10-CM | POA: Insufficient documentation

## 2024-10-21 DIAGNOSIS — Z91040 Latex allergy status: Secondary | ICD-10-CM | POA: Insufficient documentation

## 2024-10-21 DIAGNOSIS — I161 Hypertensive emergency: Secondary | ICD-10-CM | POA: Diagnosis not present

## 2024-10-21 DIAGNOSIS — R42 Dizziness and giddiness: Secondary | ICD-10-CM | POA: Diagnosis present

## 2024-10-21 DIAGNOSIS — G43009 Migraine without aura, not intractable, without status migrainosus: Secondary | ICD-10-CM

## 2024-10-21 LAB — COMPREHENSIVE METABOLIC PANEL WITH GFR
ALT: 24 U/L (ref 0–44)
AST: 20 U/L (ref 15–41)
Albumin: 4.9 g/dL (ref 3.5–5.0)
Alkaline Phosphatase: 61 U/L (ref 38–126)
Anion gap: 11 (ref 5–15)
BUN: 13 mg/dL (ref 6–20)
CO2: 24 mmol/L (ref 22–32)
Calcium: 9.7 mg/dL (ref 8.9–10.3)
Chloride: 103 mmol/L (ref 98–111)
Creatinine, Ser: 0.89 mg/dL (ref 0.44–1.00)
GFR, Estimated: >60 mL/min (ref >60)
Glucose, Bld: 94 mg/dL (ref 70–99)
Potassium: 3.6 mmol/L (ref 3.5–5.1)
Sodium: 137 mmol/L (ref 135–145)
Total Bilirubin: 0.4 mg/dL (ref 0.0–1.2)
Total Protein: 8.4 g/dL — ABNORMAL HIGH (ref 6.5–8.1)

## 2024-10-21 LAB — DIFFERENTIAL
Abs Immature Granulocytes: 0.02 K/uL (ref 0.00–0.07)
Basophils Absolute: 0 K/uL (ref 0.0–0.1)
Basophils Relative: 0 %
Eosinophils Absolute: 0.1 K/uL (ref 0.0–0.5)
Eosinophils Relative: 2 %
Immature Granulocytes: 0 %
Lymphocytes Relative: 23 %
Lymphs Abs: 2.2 K/uL (ref 0.7–4.0)
Monocytes Absolute: 0.6 K/uL (ref 0.1–1.0)
Monocytes Relative: 7 %
Neutro Abs: 6.3 K/uL (ref 1.7–7.7)
Neutrophils Relative %: 68 %

## 2024-10-21 LAB — URINE DRUG SCREEN
Amphetamines: NEGATIVE
Barbiturates: NEGATIVE
Benzodiazepines: NEGATIVE
Cocaine: NEGATIVE
Fentanyl: NEGATIVE
Methadone Scn, Ur: NEGATIVE
Opiates: NEGATIVE
Tetrahydrocannabinol: NEGATIVE

## 2024-10-21 LAB — PROTIME-INR
INR: 1 (ref 0.8–1.2)
Prothrombin Time: 14.1 s (ref 11.4–15.2)

## 2024-10-21 LAB — CBC
HCT: 43.7 % (ref 36.0–46.0)
Hemoglobin: 15 g/dL (ref 12.0–15.0)
MCH: 30.1 pg (ref 26.0–34.0)
MCHC: 34.3 g/dL (ref 30.0–36.0)
MCV: 87.6 fL (ref 80.0–100.0)
Platelets: 336 K/uL (ref 150–400)
RBC: 4.99 MIL/uL (ref 3.87–5.11)
RDW: 12.7 % (ref 11.5–15.5)
WBC: 9.2 K/uL (ref 4.0–10.5)
nRBC: 0 % (ref 0.0–0.2)

## 2024-10-21 LAB — APTT: aPTT: 31 s (ref 24–36)

## 2024-10-21 LAB — HCG, SERUM, QUALITATIVE: Preg, Serum: NEGATIVE

## 2024-10-21 MED ORDER — LACTATED RINGERS IV BOLUS
1000.0000 mL | Freq: Once | INTRAVENOUS | Status: AC
  Administered 2024-10-21: 1000 mL via INTRAVENOUS

## 2024-10-21 MED ORDER — KETOROLAC TROMETHAMINE 15 MG/ML IJ SOLN
15.0000 mg | Freq: Once | INTRAMUSCULAR | Status: AC
  Administered 2024-10-21: 15 mg via INTRAVENOUS
  Filled 2024-10-21: qty 1

## 2024-10-21 MED ORDER — IOHEXOL 350 MG/ML SOLN
80.0000 mL | Freq: Once | INTRAVENOUS | Status: AC | PRN
  Administered 2024-10-21: 80 mL via INTRAVENOUS

## 2024-10-21 MED ORDER — DIPHENHYDRAMINE HCL 50 MG/ML IJ SOLN
12.5000 mg | Freq: Once | INTRAMUSCULAR | Status: AC
  Administered 2024-10-21: 12.5 mg via INTRAVENOUS
  Filled 2024-10-21: qty 1

## 2024-10-21 MED ORDER — ACETAMINOPHEN 500 MG PO TABS
1000.0000 mg | ORAL_TABLET | Freq: Once | ORAL | Status: AC
  Administered 2024-10-21: 1000 mg via ORAL
  Filled 2024-10-21: qty 2

## 2024-10-21 MED ORDER — METOPROLOL TARTRATE 5 MG/5ML IV SOLN
5.0000 mg | Freq: Once | INTRAVENOUS | Status: AC
  Administered 2024-10-21: 5 mg via INTRAVENOUS
  Filled 2024-10-21: qty 5

## 2024-10-21 MED ORDER — AMLODIPINE BESYLATE 5 MG PO TABS
10.0000 mg | ORAL_TABLET | Freq: Once | ORAL | Status: AC
  Administered 2024-10-21: 10 mg via ORAL
  Filled 2024-10-21: qty 2

## 2024-10-21 MED ORDER — PROCHLORPERAZINE EDISYLATE 10 MG/2ML IJ SOLN
10.0000 mg | Freq: Once | INTRAMUSCULAR | Status: AC
  Administered 2024-10-21: 10 mg via INTRAVENOUS
  Filled 2024-10-21: qty 2

## 2024-10-21 NOTE — Discharge Instructions (Addendum)
 You were seen today for your headache.  Thankfully, your CT scan of the head did not show any signs of a stroke.   Your blood pressure was elevated here today which could be contributing to your headaches.  Please take 10 mg of your amlodipine daily for your blood pressure (2 of your 5mg  tablets).  You need to take your chlorthalidone daily as prescribed.   You may take up to 1000mg  of tylenol  every 6 hours as needed for headache. Do not take more then 4g per day.   You may use up to 600mg  ibuprofen  every 6 hours as needed for headache.  Do not exceed 2.4g of ibuprofen  per day. However, I would recommend taking Tylenol  first if needed as ibuprofen  can elevate your blood pressure.   Tests performed today include: CT of your head which was normal and did not show any serious cause of your headache  Blood testing to look at your blood counts, electrolytes, and kidney function which were all normal. A pregnancy test which was negative   Follow-up instructions: Please follow-up with your primary care provider within the next 2 weeks for further management of your blood pressure.  They will need to prescribe further refills of your blood pressure medications.  Return instructions:  Please return to the Emergency Department if you experience worsening symptoms. Return if the medications do not resolve your headache, if it recurs, or if you have multiple episodes of vomiting or cannot keep down fluids. Return if you have a change from your usual headache. RETURN IMMEDIATELY IF you: Develop a sudden, severe headache Develop confusion or become poorly responsive or faint Develop a fever above 100.6F or problem breathing Have a change in speech, vision, swallowing, or understanding Develop new weakness, numbness, tingling, incoordination in your arms or legs Have a seizure Have lots of pain in your neck Please return if you have any other emergent concerns.

## 2024-10-21 NOTE — Progress Notes (Signed)
 [facility] Telemedicine Consult   Requesting Provider: Benton Shone Consult Participants: Patient, bedside RN Shade Location of the provider: [facility] Location of the patient: [facility] emergency room  This consult was provided via telemedicine with 2-way video and audio communication. The patient/family was informed that care would be provided in this way and agreed to receive care in this manner.    Chief Complaint: Headache and left facial numbness  HPI: [name] is a 38 year old African-American lady with past medical history of endometriosis, hypertension and obesity.  Patient states she been having daily headaches for the last week or so.  She has been taking Tylenol  which has not worked.  She describes the headache as being moderate to severe intensity with mild nausea and light and sound sensitivity.  She was found to have significantly elevated blood pressure of 280/130 and started on Norvasc yesterday he took 1 dose and this morning she took a dose of clonidine.  At 12:00 today she noticed sudden onset of numbness involving her left forehead and cheeks.  This has persisted.  She denied any other accompanying symptoms in the form of blurred vision, double vision, vertigo, extremity weakness numbness gait or balance problems.  She has no known prior history of migraine headaches, seizures, stroke or significant neurological problems.    LKW: 12 noon tnk given?: No, symptoms too mild to treat IR Thrombectomy? No, no LVO on clinical exam or CT angio Modified Rankin Scale: 1-No significant post stroke disability and can perform usual duties with stroke symptoms Time of teleneurologist evaluation: 1413  Exam: Vitals:   10/21/24 1314 10/21/24 1330  BP:  (!) 159/114  Pulse:  74  Resp:  18  Temp: 98.2 F (36.8 C)   SpO2:  99%    General: Pleasant anxious young obese African-American lady not in distress. She she is awake alert oriented to time  place and person.  Speech and language appear normal.  There is no dysarthria or aphasia.  Extraocular movements are full range without nystagmus.  Visual fields are full to bedside confrontation testing.  Face is symmetric without weakness.  Subjective left hemifacial decree sensation.  Normal sensation in the extremities.  Coordination is accurate bilaterally.  Gait not tested. NIHSS ; 1A: Level of Consciousness - 0 1B: Ask Month and Age - 0 1C: 'Blink Eyes' & 'Squeeze Hands' - 0 2: Test Horizontal Extraocular Movements - 0 3: Test Visual Fields - 0 4: Test Facial Palsy - 0 5A: Test Left Arm Motor Drift - 0 5B: Test Right Arm Motor Drift - 0 6A: Test Left Leg Motor Drift - 0 6B: Test Right Leg Motor Drift - 0 7: Test Limb Ataxia - 0 8: Test Sensation - 1 subjective decrease sensation on left hemiface including forehead 9: Test Language/Aphasia- 0 10: Test Dysarthria - 0 11: Test Extinction/Inattention - 0  NIHSS score: 1   Imaging Reviewed: CT head no acute abnormality CT angiogram of the head and neck my preliminary review no LVO Labs reviewed in epic and pertinent values follow: Pending   Assessment: 38 year old African-American lady with 1 week history of refractory headaches with sudden onset of left hemifacial numbness in the setting of significantly elevated blood pressure of unclear etiology.  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  Doubt small brainstem infarct from small vessel disease. Patient is not a candidate for IV TNK due to minimal nondisabling deficits. Patient is not a candidate for mechanical thrombectomy as clinical presentation and radiological imaging  does not support LVO  Recommendations: Recommend treatment of headache with migraine cocktail as per ER MD.  If numbness symptoms resolved after headache no further imaging is necessary but if they persist recommend MRI scan of the brain.  Stroke is under provide recommend transfer to [facility] for further admission and stroke evaluation.  Blood pressure management as per ER MD.  Discussed with Alan Caul, ER PA-c Discussed with patient and boyfriend.   This patient is receiving care for possible acute neurological changes. There was 55 minutes of care by this provider at the time of service, including time for direct evaluation via telemedicine, review of medical records, imaging studies and discussion of findings with providers, the patient and/or family.  Eather Popp, MD [facility] [phone]  If 7pm- 7am, please page neurology on call as listed in AMION.

## 2024-10-21 NOTE — ED Triage Notes (Addendum)
 Came in POV, high BP(280/124mmHG) since yesterday,intermittent headache since last week.went to urgent care yesterday,took Amlodipine 5mg  last night.With N/V,dizziness,left face tingling sensation.Clonidine taken 0930.

## 2024-10-21 NOTE — ED Notes (Signed)
 Reviewed discharge instructions and follow-up care with pt. Pt verbalized understanding and had no further questions. Pt exited ED without complications.

## 2024-10-21 NOTE — ED Provider Notes (Signed)
 [place] EMERGENCY DEPARTMENT AT [facility] Provider Note   CSN: [id number] Arrival date & time: 10/21/24  1300  An emergency department physician performed an initial assessment on this suspected stroke patient at 1400.  Patient presents with: Dizziness   Adrienne Torres is a 38 y.o. female with history hypertension, presents with concern for headaches over the past week.  Headache spreads across her forehead.  She reports that she normally does not have any headaches.  She also had some episodes of vomiting that were associated with her headaches.  Denies any neck pain or fevers.  Denies any head trauma.  She reports that her blood pressures have been elevated at home with systolic readings over 200 sometimes at home.  She was restarted on blood pressure medication by urgent care when she was seen for this concern.   At noon today, she developed a numbness/tingling sensation in the left side of her face which is new for her.  She denies any slurred speech or facial droop.  Denies any numbness or weakness in her arms or legs. Code stroke activated upon my initial evaluation of patient.     Dizziness      Prior to Admission medications   Medication Sig Start Date End Date Taking? Authorizing Provider  acetaminophen  (TYLENOL ) 500 MG tablet Take 500 mg by mouth every 6 (six) hours as needed for moderate pain or headache.    [provider]  HYDROcodone -acetaminophen  (NORCO/VICODIN) 5-325 MG tablet Take 1-2 tablets by mouth every 6 (six) hours as needed for moderate pain or severe pain. 01/06/22   Armenta Canning, MD  ibuprofen  (ADVIL ) 800 MG tablet Take 1 tablet (800 mg total) by mouth 3 (three) times daily. 01/06/22   Armenta Canning, MD  ibuprofen  (ADVIL ,MOTRIN ) 800 MG tablet Take 1 tablet (800 mg total) by mouth every 8 (eight) hours as needed for mild pain. Patient not taking: Reported on 01/06/2022 06/07/15   Ward, Josette SAILOR, DO  loratadine  (CLARITIN ) 10 MG tablet Take  1 tablet (10 mg total) by mouth daily. Patient not taking: Reported on 01/06/2022 02/05/18   Carlyle Lenis, MD  methocarbamol  (ROBAXIN -750) 750 MG tablet Take 1 tablet (750 mg total) by mouth 4 (four) times daily. 01/06/22   Armenta Canning, MD  naproxen  (NAPROSYN ) 500 MG tablet Take 1 tablet (500 mg total) by mouth 2 (two) times daily. Patient not taking: Reported on 01/06/2022 11/16/19   Zackowski, Scott, MD  polyethylene glycol (MIRALAX ) packet Take 17 g by mouth daily. Patient not taking: Reported on 01/06/2022 09/01/18   Layden, Shammara A, PA-C    Allergies: Latex    Review of Systems  Neurological:  Positive for dizziness.    Updated Vital Signs BP (!) 172/117   Pulse 74   Temp 98.2 F (36.8 C) (Oral)   Resp 16   Ht 5' 8 (1.727 m)   Wt 110.7 kg   SpO2 99%   BMI 37.11 kg/m   Physical Exam Vitals and nursing note reviewed.  Constitutional:      General: She is not in acute distress.    Appearance: She is well-developed.     Comments: Sitting in room with lights off.  No active vomiting.  HENT:     Head: Normocephalic and atraumatic.  Eyes:     Conjunctiva/sclera: Conjunctivae normal.  Cardiovascular:     Rate and Rhythm: Normal rate and regular rhythm.     Heart sounds: No murmur heard. Pulmonary:     Effort: Pulmonary  effort is normal. No respiratory distress.     Breath sounds: Normal breath sounds.  Abdominal:     Palpations: Abdomen is soft.     Tenderness: There is no abdominal tenderness.  Musculoskeletal:        General: No swelling.     Cervical back: Neck supple.  Skin:    General: Skin is warm and dry.     Capillary Refill: Capillary refill takes less than 2 seconds.  Neurological:     Mental Status: She is alert.     Comments: Mental status: Alert and oriented to self, place, and month  Speech: Answers questions appropriately  Cranial Nerves: III, IV, VI: EOM intact, Pupils equal round and reactive, no gaze preference or deviation, no nystagmus. V:  reports diminished sensation in V1, V2, and V3 segments on left side of face, normal sensation on right side of face VII: smiles, puffs cheeks, raises eyebrows, and closes eyes without asymmetry.  VIII: normal hearing to speech IX, X: normal palatal elevation, no uvular deviation XI: 5/5 head turn and 5/5 shoulder shrug bilaterally XII: midline tongue protrusion  Motor: 5/5 strength in right shoulder abductors/adductors, elbow flexors/extensors, wrist flexors/extensors, finger abductors/adductors.  5/5 in left hipflexors/extensors, knee flexors/extensors, ankle dorsiflexion and plantarflexion  5/5 strength in left shoulder abductors/adductors, elbow flexors/extensors, wrist flexors/extensors, finger abductors/adductors.  5/5 in left hipflexors/extensors, knee flexors/extensors, ankle dorsiflexion and plantarflexion   Sensory: Intact sensation in upper and lower extremity bilaterally    Coordination: Normal finger to nose and heel to shin, no tremor, no dysmetria  Gait: Normal; patient able to tip-toe, heel-walk.   Psychiatric:        Mood and Affect: Mood normal.     (all labs ordered are listed, but only abnormal results are displayed) Labs Reviewed  COMPREHENSIVE METABOLIC PANEL WITH GFR - Abnormal; Notable for the following components:      Result Value   Total Protein 8.4 (*)    All other components within normal limits  PROTIME-INR  APTT  CBC  DIFFERENTIAL  URINE DRUG SCREEN  HCG, SERUM, QUALITATIVE    EKG: None  Radiology: CT ANGIO HEAD NECK W WO CM (CODE STROKE) Result Date: 10/21/2024 EXAM: CTA HEAD AND NECK WITHOUT AND WITH 10/21/2024 02:26:43 PM TECHNIQUE: CTA of the head and neck was performed without and with the administration of 80 mL iohexol (OMNIPAQUE) 350 MG/ML injection. Multiplanar 2D and/or 3D reformatted images are provided for review. Automated exposure control, iterative reconstruction, and/or weight based adjustment of the mA/kV was utilized to  reduce the radiation dose to as low as reasonably achievable. Stenosis of the internal carotid arteries measured using NASCET criteria. COMPARISON: Same day head CT CLINICAL HISTORY: Neuro deficit, acute, stroke suspected. Dizziness. FINDINGS: CTA NECK: AORTIC ARCH AND ARCH VESSELS: No dissection or arterial injury. No significant stenosis of the brachiocephalic or subclavian arteries. CERVICAL CAROTID ARTERIES: The right carotid artery is patent from the origin to the skull base without hemodynamically significant stenosis. The left carotid artery is patent from the origin to the skull base without hemodynamically significant stenosis. No dissection or arterial injury. CERVICAL VERTEBRAL ARTERIES: The vertebral arteries are patent from the origins to the vertebral base of the confluence. There is a small focus of mixed atherosclerotic plaque at the origin of the left vertebral artery without significant stenosis. There is tortuosity of the bilateral V2 segments, slightly more pronounced on the left. LUNGS AND MEDIASTINUM: Unremarkable. SOFT TISSUES: No acute abnormality. BONES: No acute abnormality. CTA HEAD:  ANTERIOR CIRCULATION: No significant stenosis of the internal carotid arteries. No significant stenosis of the anterior cerebral arteries. No significant stenosis of the middle cerebral arteries. No aneurysm. POSTERIOR CIRCULATION: The intracranial segments of the vertebral arteries are patent without significant stenosis, consistent with findings in the cervical segments. No significant stenosis of the posterior cerebral arteries. No significant stenosis of the basilar artery. No aneurysm. OTHER: No dural venous sinus thrombosis on this non-dedicated study. IMPRESSION: 1. No large vessel occlusion, hemodynamically significant stenosis, or aneurysm in the head or neck. 2. Small mixed atherosclerotic plaque at the origin of the left vertebral artery without significant stenosis. Electronically signed by:  Donnice Mania MD 10/21/2024 02:51 PM EDT RP Workstation: [id number]   CT HEAD CODE STROKE WO CONTRAST (LKW 0-4.5h, LVO 0-24h) Result Date: 10/21/2024 EXAM: CT HEAD WITHOUT 10/21/2024 02:13:22 PM TECHNIQUE: CT of the head was performed without the administration of intravenous contrast. Automated exposure control, iterative reconstruction, and/or weight based adjustment of the mA/kV was utilized to reduce the radiation dose to as low as reasonably achievable. COMPARISON: CT head 01/06/2022 CLINICAL HISTORY: Neuro deficit, acute, stroke suspected; L sided facial numbness. FINDINGS: BRAIN AND VENTRICLES: No acute intracranial hemorrhage. No mass effect or midline shift. No extra-axial fluid collection. No evidence of acute infarct. No hydrocephalus. [place] Stroke Program Early CT (ASPECT) Score: Ganglionic (caudate, internal capsule, lentiform nucleus, insula, M1-M3): 7 Supraganglionic (M4-M6): 3 Total: 10 ORBITS: No acute abnormality. SINUSES AND MASTOIDS: Mild mucosal thickening in the right maxillary sinus. SOFT TISSUES AND SKULL: No acute skull fracture. No acute soft tissue abnormality. IMPRESSION: 1. No acute intracranial abnormality. 2. ASPECTS is 10. 3. Findings discussed with Dr. Doretha at 2:21PM on 10/21/24. Electronically signed by: Donnice Mania MD 10/21/2024 02:22 PM EDT RP Workstation: [id number]     Procedures   Medications Ordered in the ED  iohexol (OMNIPAQUE) 350 MG/ML injection 80 mL (80 mLs Intravenous Contrast Given 10/21/24 1418)  prochlorperazine (COMPAZINE) injection 10 mg (10 mg Intravenous Given 10/21/24 1505)  diphenhydrAMINE (BENADRYL) injection 12.5 mg (12.5 mg Intravenous Given 10/21/24 1504)  lactated ringers bolus 1,000 mL (1,000 mLs Intravenous New Bag/Given 10/21/24 1514)  metoprolol tartrate (LOPRESSOR) injection 5 mg (5 mg Intravenous Given 10/21/24 1505)  acetaminophen  (TYLENOL ) tablet 1,000 mg (1,000 mg Oral Given 10/21/24 1507)  ketorolac  (TORADOL ) 15 MG/ML  injection 15 mg (15 mg Intravenous Given 10/21/24 1503)  amLODipine (NORVASC) tablet 10 mg (10 mg Oral Given 10/21/24 1614)  metoprolol tartrate (LOPRESSOR) injection 5 mg (5 mg Intravenous Given 10/21/24 1615)    Clinical Course as of 10/21/24 1703  Fri Oct 21, 2024  1400 Last known well 12pm, Left sided facial numbness.  Code stroke activated. [AF]  1439 Consulted with Dr. Rosemarie with neurology, he states that CT of the head did not show any signs of acute stroke.  He recommends treating patient's headache and blood pressure at this time.  If numbness in the face does not resolve after treatment of blood pressure and migraine, recommends MRI brain. [AF]  1648 Upon reevaluation after receiving migraine cocktail, patient reports resolution of headache.  She no longer has the numbness on the left side of her face.  No vomiting, tolerating p.o. intake. [AF]    Clinical Course User Index [AF] Veta Palma, PA-C                                 Medical Decision Making Amount and/or Complexity  of Data Reviewed Labs: ordered. Radiology: ordered.  Risk OTC drugs. Prescription drug management.     Differential diagnosis includes but is not limited to TIA, CVA, uncontrolled hypertension, tension headache, migraine, temporal arteritis, trigeminal neuralgia, cluster headache, meningitis, concussion, intracranial mass, intracranial hemorrhage, carbon monoxide poisoning, sinus venous thrombosis   ED Course:  Upon initial evaluation, patient appears uncomfortable and is sitting in the room with the lights off.  Reports tension type headache across her forehead.  She reports that the left side of her face started to feel numb at noon today, 2 hours ago.  She does report diminished sensation when I touch the left side of her face compared to the right side in V1, V2, and V3 distribution.  Code stroke was initiated upon arrival for this although higher suspicion that this may be a atypical  migraine.  She does not have any other neurologic deficits on exam such as slurred speech, facial droop, arm or leg numbness or weakness.  Labs Ordered: I Ordered, and personally interpreted labs.  The pertinent results include:   CBC and CMP within normal limits Pregnancy negative   Imaging Studies ordered: I ordered imaging studies including CT head, CTA head and neck I independently visualized the imaging with scope of interpretation limited to determining acute life threatening conditions related to emergency care. Imaging showed no acute abnormalities I agree with the radiologist interpretation   Cardiac Monitoring: / EKG: The patient was maintained on a cardiac monitor.  I personally viewed and interpreted the cardiac monitored which showed an underlying rhythm of: Normal sinus rhythm   Consultations Obtained: I requested consultation with the neurologist Dr. Rosemarie,  and discussed lab and imaging findings as well as pertinent plan - they recommend: He did not see any sign of vessel occlusion or stroke on patient's head imaging.  He recommends treatment of patient's headache, and if left-sided facial numbness persists, getting MRI at that time.  If patient's headache resolves, this is likely atypical migraine and she would not need any further neurology follow-up  Medications Given: Toradol  Tylenol  Compazine Benadryl LR bolus Amlodipine Metoprolol  Upon re-evaluation, patient reports headache has completely resolved with the medications given today. Her left sided facial numbness has resolved.  Feel her symptoms are due to atypical migraine rather than TIA or CVA.  No indication for further MRI imaging at this time.  Her blood pressure still remains elevated at 182/114 despite 10 mg of metoprolol today.  Discussed that we will switch her home 5 mg of amlodipine to 10 mg daily.  We will have her take 2 of her 5 mg tablets daily.  We will have her continue on chlorthalidone daily as  prescribed by urgent care.  She states she has an appointment with her PCP on 11/02/2024 which is in 11 days.  We will have her follow-up with the PCP to recheck her blood pressure and for further refills of the blood pressure medication.  Stable and appropriate for discharge home.    Impression: Atypical migraine Hypertension  Disposition:  The patient was discharged home with instructions to follow-up with her PCP on her appointment on 11/02/2024 for further management of her blood pressure and further routine care.  Take 10 mg of her amlodipine daily.  Continue on home chlorthalidone as prescribed.  Tylenol  and ibuprofen  as needed for headache. Return precautions given and patient verbalized understanding.    Record Review: External records from outside source obtained and reviewed including urgent care note for hypertension  yesterday     This chart was dictated using voice recognition software, Dragon. Despite the best efforts of this provider to proofread and correct errors, errors may still occur which can change documentation meaning.       Final diagnoses:  Atypical migraine  Hypertension, unspecified type    ED Discharge Orders     None          Veta Palma, PA-C 10/21/24 1703    Doretha Folks, MD 10/31/24 1255
